# Patient Record
Sex: Female | Born: 1990 | Race: White | Hispanic: No
Health system: Southern US, Community
[De-identification: ages and names within clinical notes are randomized; demographics above are authoritative.]

## PROBLEM LIST (undated history)

## (undated) ENCOUNTER — Inpatient Hospital Stay (HOSPITAL_COMMUNITY): Payer: Self-pay

## (undated) DIAGNOSIS — N83201 Unspecified ovarian cyst, right side: Secondary | ICD-10-CM

## (undated) DIAGNOSIS — E039 Hypothyroidism, unspecified: Secondary | ICD-10-CM

## (undated) DIAGNOSIS — J189 Pneumonia, unspecified organism: Secondary | ICD-10-CM

## (undated) DIAGNOSIS — E079 Disorder of thyroid, unspecified: Secondary | ICD-10-CM

## (undated) DIAGNOSIS — F32A Depression, unspecified: Secondary | ICD-10-CM

## (undated) DIAGNOSIS — D649 Anemia, unspecified: Secondary | ICD-10-CM

## (undated) DIAGNOSIS — F419 Anxiety disorder, unspecified: Secondary | ICD-10-CM

## (undated) DIAGNOSIS — R112 Nausea with vomiting, unspecified: Secondary | ICD-10-CM

## (undated) DIAGNOSIS — J45909 Unspecified asthma, uncomplicated: Secondary | ICD-10-CM

## (undated) DIAGNOSIS — T8859XA Other complications of anesthesia, initial encounter: Secondary | ICD-10-CM

## (undated) DIAGNOSIS — Z9889 Other specified postprocedural states: Secondary | ICD-10-CM

## (undated) HISTORY — DX: Unspecified asthma, uncomplicated: J45.909

## (undated) HISTORY — PX: CERVICAL CERCLAGE: SHX1329

## (undated) HISTORY — PX: ANKLE FRACTURE SURGERY: SHX122

---
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
## 2013-07-25 ENCOUNTER — Emergency Department (HOSPITAL_COMMUNITY)
Admission: EM | Admit: 2013-07-25 | Discharge: 2013-07-25 | Disposition: A | Discharge status: Home / Self Care | Payer: BC Managed Care – PPO | Source: Home / Self Care | Attending: Family Medicine | Admitting: Family Medicine

## 2013-07-25 ENCOUNTER — Encounter (HOSPITAL_COMMUNITY): Payer: Self-pay | Admitting: Emergency Medicine

## 2013-07-25 DIAGNOSIS — K121 Other forms of stomatitis: Secondary | ICD-10-CM

## 2013-07-25 LAB — POCT RAPID STREP A: Streptococcus, Group A Screen (Direct): NEGATIVE

## 2013-07-25 MED ORDER — MAGIC MOUTHWASH: 5.0000 mL | Freq: Three times a day (TID) | ORAL | Status: AC

## 2013-07-25 NOTE — ED Notes (Signed)
22 yr is here today with complaints of mouth irritation - lesion on roof of mouth x 2 dys. She also states that she has a ST, chills, ear pain - B, cough-dry, sinus press - bridge of nose and HA x 2 dys. Denies: SOB; chest congestion

## 2013-07-25 NOTE — ED Provider Notes (Signed)
CSN: [id number]     Arrival date & time 07/25/13  1234 History   First MD Initiated Contact with Patient 07/25/13 1442     Chief Complaint  Patient presents with  . Sinusitis    ST.chills;ear pain; cough - dry; sinus press - bridge of nose; HA x 2 dys  . Mouth Lesions    roof of mouth   (Consider location/radiation/quality/duration/timing/severity/associated sxs/prior Treatment) HPI Comments: Patient report several days of URI sx without associated fever. Describes ST, sinus congestion/pressure, ear congestion/pressure, tender cervical lymph nodes, and generalized malaise. Also reports multiple "sores" on the roof of her mouth that are painful.   The history is provided by the patient.    History reviewed. No pertinent past medical history. Past Surgical History  Procedure Laterality Date  . Ankle fracture surgery    . Cesarean section     No family history on file. History  Substance Use Topics  . Smoking status: Never Smoker   . Smokeless tobacco: Not on file  . Alcohol Use: Yes   OB History   Grav Para Term Preterm Abortions TAB SAB Ect Mult Living                 Review of Systems  Constitutional: Positive for chills. Negative for fever.  HENT: Positive for mouth sores and sinus pressure.   Eyes: Negative.   Respiratory: Negative.   Cardiovascular: Negative.   Gastrointestinal: Negative.   Genitourinary: Negative.   Musculoskeletal: Positive for myalgias.  Skin: Positive for rash.       See HPI  Neurological: Negative.   Psychiatric/Behavioral: Negative.     Allergies  Review of patient's allergies indicates no known allergies.  Home Medications   Current Outpatient Rx  Name  Route  Sig  Dispense  Refill  . Alum & Mag Hydroxide-Simeth (MAGIC MOUTHWASH) SOLN   Oral   Take 5 mLs by mouth 3 (three) times daily.   150 mL   0    BP 121/76  Pulse 99  Temp(Src) 99 F (37.2 C) (Oral)  Resp 18  SpO2 100%  LMP 07/14/2013 Physical Exam  Nursing note  and vitals reviewed. Constitutional: She appears well-developed and well-nourished. No distress.  HENT:  Head: Normocephalic and atraumatic.  Right Ear: Hearing, tympanic membrane, external ear and ear canal normal.  Left Ear: Hearing, tympanic membrane, external ear and ear canal normal.  Nose: Nose normal. Right sinus exhibits no maxillary sinus tenderness and no frontal sinus tenderness. Left sinus exhibits no maxillary sinus tenderness and no frontal sinus tenderness.  Mouth/Throat:    Pulmonary/Chest: Effort normal and breath sounds normal.    ED Course  Procedures (including critical care time) Labs Review Labs Reviewed  CULTURE, GROUP A STREP  POCT RAPID STREP A ([facility] CARE ONLY)   Imaging Review No results found.  EKG Interpretation    Date/Time:    Ventricular Rate:    PR Interval:    QRS Duration:   QT Interval:    QTC Calculation:   R Axis:     Text Interpretation:              MDM   1. Stomatitis    Rapid strep screen negative.    Jess Barters [place], [place] 07/25/13 1725

## 2013-07-27 NOTE — ED Provider Notes (Signed)
Medical screening examination/treatment/procedure(s) were performed by a resident physician or non-physician practitioner and as the supervising physician I was immediately available for consultation/collaboration.  Clementeen Graham, MD    Rodolph Bong, MD 07/27/13 [phone]

## 2013-07-28 LAB — CULTURE, GROUP A STREP

## 2014-02-13 ENCOUNTER — Observation Stay (HOSPITAL_COMMUNITY)
Admission: EM | Admit: 2014-02-13 | Discharge: 2014-02-14 | Disposition: A | Discharge status: Home / Self Care | Payer: Medicaid Other | Attending: Emergency Medicine | Admitting: Emergency Medicine

## 2014-02-13 ENCOUNTER — Encounter (HOSPITAL_COMMUNITY): Payer: Self-pay | Admitting: Emergency Medicine

## 2014-02-13 DIAGNOSIS — R110 Nausea: Secondary | ICD-10-CM | POA: Diagnosis not present

## 2014-02-13 DIAGNOSIS — M79604 Pain in right leg: Secondary | ICD-10-CM | POA: Diagnosis present

## 2014-02-13 DIAGNOSIS — M7989 Other specified soft tissue disorders: Secondary | ICD-10-CM

## 2014-02-13 DIAGNOSIS — Z3202 Encounter for pregnancy test, result negative: Secondary | ICD-10-CM | POA: Diagnosis not present

## 2014-02-13 DIAGNOSIS — R0602 Shortness of breath: Secondary | ICD-10-CM | POA: Diagnosis not present

## 2014-02-13 DIAGNOSIS — R609 Edema, unspecified: Principal | ICD-10-CM | POA: Insufficient documentation

## 2014-02-13 DIAGNOSIS — R600 Localized edema: Secondary | ICD-10-CM

## 2014-02-13 LAB — CBC WITH DIFFERENTIAL/PLATELET
BASOS ABS: 0 10*3/uL (ref 0.0–0.1)
BASOS PCT: 1 % (ref 0–1)
Eosinophils Absolute: 0.2 10*3/uL (ref 0.0–0.7)
Eosinophils Relative: 2 % (ref 0–5)
HCT: 37.7 % (ref 36.0–46.0)
Hemoglobin: 12.6 g/dL (ref 12.0–15.0)
LYMPHS ABS: 1.8 10*3/uL (ref 0.7–4.0)
Lymphocytes Relative: 27 % (ref 12–46)
MCH: 30.3 pg (ref 26.0–34.0)
MCHC: 33.4 g/dL (ref 30.0–36.0)
MCV: 90.6 fL (ref 78.0–100.0)
Monocytes Absolute: 0.4 10*3/uL (ref 0.1–1.0)
Monocytes Relative: 6 % (ref 3–12)
NEUTROS PCT: 64 % (ref 43–77)
Neutro Abs: 4.2 10*3/uL (ref 1.7–7.7)
Platelets: 268 10*3/uL (ref 150–400)
RBC: 4.16 MIL/uL (ref 3.87–5.11)
RDW: 13.6 % (ref 11.5–15.5)
WBC: 6.5 10*3/uL (ref 4.0–10.5)

## 2014-02-13 LAB — COMPREHENSIVE METABOLIC PANEL
ALBUMIN: 4 g/dL (ref 3.5–5.2)
ALK PHOS: 40 U/L (ref 39–117)
ALT: 41 U/L — AB (ref 0–35)
AST: 23 U/L (ref 0–37)
BUN: 11 mg/dL (ref 6–23)
CALCIUM: 9.4 mg/dL (ref 8.4–10.5)
CO2: 28 mEq/L (ref 19–32)
Chloride: 100 mEq/L (ref 96–112)
Creatinine, Ser: 0.65 mg/dL (ref 0.50–1.10)
GFR calc Af Amer: >90 mL/min (ref >90)
GFR calc non Af Amer: >90 mL/min (ref >90)
GLUCOSE: 93 mg/dL (ref 70–99)
POTASSIUM: 3.7 meq/L (ref 3.7–5.3)
SODIUM: 140 meq/L (ref 137–147)
TOTAL PROTEIN: 7.2 g/dL (ref 6.0–8.3)
Total Bilirubin: 0.3 mg/dL (ref 0.3–1.2)

## 2014-02-13 LAB — URINALYSIS, ROUTINE W REFLEX MICROSCOPIC
Bilirubin Urine: NEGATIVE
Glucose, UA: NEGATIVE mg/dL
HGB URINE DIPSTICK: NEGATIVE
KETONES UR: NEGATIVE mg/dL
Leukocytes, UA: NEGATIVE
NITRITE: NEGATIVE
Protein, ur: NEGATIVE mg/dL
SPECIFIC GRAVITY, URINE: 1.02 (ref 1.005–1.030)
UROBILINOGEN UA: 0.2 mg/dL (ref 0.0–1.0)
pH: 6 (ref 5.0–8.0)

## 2014-02-13 LAB — POC URINE PREG, ED: PREG TEST UR: NEGATIVE

## 2014-02-13 NOTE — ED Notes (Signed)
Patient also reports possibility of pregnancy.

## 2014-02-13 NOTE — ED Provider Notes (Signed)
Patient seen/ex[facility] in the Emergency Department [facility] reports right LE pain/swelling Exam : right LE is erythematous and with pitting edema and is larger than left LE.  Distal pulses intact Plan: will need evaluation for DVT and also treatment for possible cellulitis    Joya Gaskinsonald W Ryszard Socarras, MD 02/13/14 2357

## 2014-02-13 NOTE — ED Notes (Signed)
Patient reports lower extremity edema x 2 weeks, worse to right leg.

## 2014-02-13 NOTE — ED Provider Notes (Signed)
CSN: [id number]     Arrival date & time 02/13/14  2131 History   First MD Initiated Contact with Patient 02/13/14 2153     Chief Complaint  Patient presents with  . Leg Swelling     (Consider location/radiation/quality/duration/timing/severity/associated sxs/prior Treatment) The history is provided by the patient.   Claudia Price is a G3 P1, LMP 01/18/2014. She was using IUD for birth control until 5/18 and had it taken out. She presents to the ED with lower extremity swelling and the possibility of being pregnant. She states that her leg started swelling about 2 weeks ago. The patient traveled her from [place] 2 weeks ago. The swelling was mild prior to leaving on the trip for a day or two but would go down when she elevated her leg. Since she arrived here the symptoms are worse. The swelling has gotten worse in the right leg and now there is redness. The swelling extends above knee on the right. She has never had anything like this before. She denies calf pain. The pain increased with walking and standing for a long time. Hx of ankle fracture to the right that required surgery 2004 but has done well since then. The patient since she just  moved to town she has no transportation. They were able to go on line and find someone that agreed to bring them to the ER tonight.    History reviewed. No pertinent past medical history. Past Surgical History  Procedure Laterality Date  . Ankle fracture surgery    . Cesarean section     History reviewed. No pertinent family history. History  Substance Use Topics  . Smoking status: Never Smoker   . Smokeless tobacco: Not on file  . Alcohol Use: Yes   OB History   Grav Para Term Preterm Abortions TAB SAB Ect Mult Living                 Review of Systems  Constitutional: Negative for chills and unexpected weight change.  HENT: Negative.   Eyes: Negative for pain, redness, itching and visual disturbance.  Respiratory: Positive for shortness  of breath. Negative for cough.   Cardiovascular: Negative for chest pain.  Gastrointestinal: Positive for nausea. Negative for vomiting and abdominal pain.  Genitourinary: Negative for dysuria, urgency, frequency, vaginal bleeding and vaginal discharge.  Musculoskeletal: Negative for back pain and myalgias.       Bilateral lower extremity swelling, right more than left.   Skin: Negative for rash.  Psychiatric/Behavioral: Negative for confusion. The patient is not nervous/anxious.       Allergies  Review of patient's allergies indicates no known allergies.  Home Medications   Prior to Admission medications   Not on File   BP 119/63  Pulse 79  Temp(Src) 97.8 F (36.6 C) (Oral)  Resp 20  Ht 5\' 11"  (1.803 m)  Wt 309 lb 14.4 oz (140.57 kg)  BMI 43.24 kg/m2  SpO2 100%  LMP 01/18/2014 Physical Exam  Nursing note and vitals reviewed. Constitutional: She is oriented to person, place, and time. She appears well-developed and well-nourished. No distress.  HENT:  Head: Normocephalic and atraumatic.  Eyes: Conjunctivae and EOM are normal.  Neck: Normal range of motion. Neck supple.  Cardiovascular: Normal rate and regular rhythm.   Pulmonary/Chest: Effort normal. No respiratory distress. She has no rales.  Abdominal: Soft. There is no tenderness.  Musculoskeletal: Normal range of motion. She exhibits edema.       Right lower leg: She  exhibits tenderness and edema.  There is edema and erythema and tenderness to the right lower leg. Pedal pulses equal, adequate circulation, good touch sensation. No calf pain on exam.  Neurological: She is alert and oriented to person, place, and time. No cranial nerve deficit.  Skin: Skin is warm and dry.  Psychiatric: She has a normal mood and affect. Her behavior is normal.   Results for orders placed during the hospital encounter of 02/13/14 (from the past 24 hour(s))  URINALYSIS, ROUTINE W REFLEX MICROSCOPIC     Status: Abnormal   Collection  Time    02/13/14 10:20 PM      Result Value Ref Range   Color, Urine YELLOW  YELLOW   APPearance HAZY (*) CLEAR   Specific Gravity, Urine 1.020  1.005 - 1.030   pH 6.0  5.0 - 8.0   Glucose, UA NEGATIVE  NEGATIVE mg/dL   Hgb urine dipstick NEGATIVE  NEGATIVE   Bilirubin Urine NEGATIVE  NEGATIVE   Ketones, ur NEGATIVE  NEGATIVE mg/dL   Protein, ur NEGATIVE  NEGATIVE mg/dL   Urobilinogen, UA 0.2  0.0 - 1.0 mg/dL   Nitrite NEGATIVE  NEGATIVE   Leukocytes, UA NEGATIVE  NEGATIVE  POC URINE PREG, ED     Status: None   Collection Time    02/13/14 10:22 PM      Result Value Ref Range   Preg Test, Ur NEGATIVE  NEGATIVE  CBC WITH DIFFERENTIAL     Status: None   Collection Time    02/13/14 10:59 PM      Result Value Ref Range   WBC 6.5  4.0 - 10.5 K/uL   RBC 4.16  3.87 - 5.11 MIL/uL   Hemoglobin 12.6  12.0 - 15.0 g/dL   HCT 95.637.7  21.336.0 - 08.646.0 %   MCV 90.6  78.0 - 100.0 fL   MCH 30.3  26.0 - 34.0 pg   MCHC 33.4  30.0 - 36.0 g/dL   RDW 57.813.6  46.911.5 - 62.915.5 %   Platelets 268  150 - 400 K/uL   Neutrophils Relative % 64  43 - 77 %   Neutro Abs 4.2  1.7 - 7.7 K/uL   Lymphocytes Relative 27  12 - 46 %   Lymphs Abs 1.8  0.7 - 4.0 K/uL   Monocytes Relative 6  3 - 12 %   Monocytes Absolute 0.4  0.1 - 1.0 K/uL   Eosinophils Relative 2  0 - 5 %   Eosinophils Absolute 0.2  0.0 - 0.7 K/uL   Basophils Relative 1  0 - 1 %   Basophils Absolute 0.0  0.0 - 0.1 K/uL  COMPREHENSIVE METABOLIC PANEL     Status: Abnormal   Collection Time    02/13/14 10:59 PM      Result Value Ref Range   Sodium 140  137 - 147 mEq/L   Potassium 3.7  3.7 - 5.3 mEq/L   Chloride 100  96 - 112 mEq/L   CO2 28  19 - 32 mEq/L   Glucose, Bld 93  70 - 99 mg/dL   BUN 11  6 - 23 mg/dL   Creatinine, Ser 5.280.65  0.50 - 1.10 mg/dL   Calcium 9.4  8.4 - 41.310.5 mg/dL   Total Protein 7.2  6.0 - 8.3 g/dL   Albumin 4.0  3.5 - 5.2 g/dL   AST 23  0 - 37 U/L   ALT 41 (*) 0 - 35 U/L   Alkaline  Phosphatase 40  39 - 117 U/L   Total Bilirubin  0.3  0.3 - 1.2 mg/dL   GFR calc non Af Amer >90  >90 mL/min   GFR calc Af Amer >90  >90 mL/min    ED Course  Procedures Dr. Bebe Shaggy in to examine the patient and feel that patient needs to come in for observation and antibiotics and have DVT study in the am.  MDM  23 y.o. female with right lower extremity edema and erythema. Will admit for observation and start antibiotics and Lovenox. Dr. Onalee Hua to admit.       [facility] Orlene Och, [place] 02/14/14 [phone]

## 2014-02-14 ENCOUNTER — Encounter (HOSPITAL_COMMUNITY): Payer: Self-pay | Admitting: *Deleted

## 2014-02-14 ENCOUNTER — Observation Stay (HOSPITAL_COMMUNITY): Payer: Medicaid Other

## 2014-02-14 DIAGNOSIS — M79604 Pain in right leg: Secondary | ICD-10-CM | POA: Diagnosis present

## 2014-02-14 DIAGNOSIS — M7989 Other specified soft tissue disorders: Secondary | ICD-10-CM

## 2014-02-14 DIAGNOSIS — M79609 Pain in unspecified limb: Secondary | ICD-10-CM

## 2014-02-14 DIAGNOSIS — R609 Edema, unspecified: Principal | ICD-10-CM

## 2014-02-14 LAB — HCG, SERUM, QUALITATIVE: Preg, Serum: NEGATIVE

## 2014-02-14 LAB — D-DIMER, QUANTITATIVE: D-Dimer, Quant: <0.27 ug/mL-FEU (ref 0.00–0.48)

## 2014-02-14 MED ORDER — ENOXAPARIN SODIUM 150 MG/ML ~~LOC~~ SOLN
1.0000 mg/kg | Freq: Once | SUBCUTANEOUS | Status: AC
  Administered 2014-02-14: 140 mg via SUBCUTANEOUS
  Filled 2014-02-14: qty 1

## 2014-02-14 MED ORDER — ENOXAPARIN SODIUM 150 MG/ML ~~LOC~~ SOLN
1.0000 mg/kg | Freq: Two times a day (BID) | SUBCUTANEOUS | Status: DC
Start: 2014-02-14 — End: 2014-02-14
  Administered 2014-02-14: 140 mg via SUBCUTANEOUS
  Filled 2014-02-14 (×3): qty 1

## 2014-02-14 MED ORDER — ENOXAPARIN SODIUM 150 MG/ML ~~LOC~~ SOLN
SUBCUTANEOUS | Status: AC
  Filled 2014-02-14: qty 1

## 2014-02-14 MED ORDER — ENOXAPARIN SODIUM 40 MG/0.4ML ~~LOC~~ SOLN: 40.0000 mg | SUBCUTANEOUS | Status: DC

## 2014-02-14 MED ORDER — CLINDAMYCIN PHOSPHATE 600 MG/50ML IV SOLN
600.0000 mg | Freq: Once | INTRAVENOUS | Status: AC
  Administered 2014-02-14: 600 mg via INTRAVENOUS
  Filled 2014-02-14: qty 50

## 2014-02-14 MED ORDER — PRENATAL MULTIVITAMIN CH
1.0000 | ORAL_TABLET | Freq: Every day | ORAL | Status: DC
  Administered 2014-02-14: 1 via ORAL
  Filled 2014-02-14 (×2): qty 1

## 2014-02-14 MED ORDER — SODIUM CHLORIDE 0.9 % IJ SOLN: 3.0000 mL | INTRAMUSCULAR | Status: DC | PRN

## 2014-02-14 MED ORDER — SODIUM CHLORIDE 0.9 % IJ SOLN
3.0000 mL | Freq: Two times a day (BID) | INTRAMUSCULAR | Status: DC
  Administered 2014-02-14: 3 mL via INTRAVENOUS

## 2014-02-14 MED ORDER — SODIUM CHLORIDE 0.9 % IV SOLN: 250.0000 mL | INTRAVENOUS | Status: DC | PRN

## 2014-02-14 NOTE — Discharge Summary (Signed)
Physician Discharge Summary  Claudia Price ZOX:[id number] DOB: 11/09/1990 DOA: 02/13/2014  PCP: No PCP Per Patient  Admit date: 02/13/2014 Discharge date: 02/14/2014  Time spent: 20 minutes  Recommendations for Outpatient Follow-up:  1. Recommended establish as OP with a PCP  2. No Abx or further work-up required  Discharge Diagnoses:  Principal Problem:   Right leg swelling Active Problems:   Leg pain, right   Discharge Condition: good  Diet recommendation: reglar  Filed Weights   02/13/14 2145  Weight: 140.57 kg (309 lb 14.4 oz)    History of present illness:  23 yr old female admitted with LE bilateral swelling-came to Ed with Risk factors of 9 hour travel, to be rlued out for DVT  Empirically started on Lovenox.  Vasc Koreas performed showed no findings. LE's actually started to swell 2 weeks rpior to admit with mild erythema-no concern for cellulitis as no significant erythema [other than sunburnt appearance no more than on UE's] D/c home with instructions to find PCP, no need abx  Discharge Exam: Filed Vitals:   02/14/14 0636  BP: 102/66  Pulse: 71  Temp: 98.3 F (36.8 C)  Resp: 20    General: alert pleasant oriented Cardiovascular: s1 s 2no m/r/g Respiratory: clear, no added sound  Discharge Instructions You were cared for by a hospitalist during your hospital stay. If you have any questions about your discharge medications or the care you received while you were in the hospital after you are discharged, you can call the unit and asked to speak with the hospitalist on call if the hospitalist that took care of you is not available. Once you are discharged, your primary care physician will handle any further medical issues. Please note that NO REFILLS for any discharge medications will be authorized once you are discharged, as it is imperative that you return to your primary care physician (or establish a relationship with a primary care physician if you do not  have one) for your aftercare needs so that they can reassess your need for medications and monitor your lab values.  Discharge Instructions   Diet - low sodium heart healthy    Complete by:  As directed      Increase activity slowly    Complete by:  As directed             Medication List         prenatal multivitamin Tabs tablet  Take 1 tablet by mouth daily at 12 noon.       No Known Allergies    The results of significant diagnostics from this hospitalization (including imaging, microbiology, ancillary and laboratory) are listed below for reference.    Significant Diagnostic Studies: Koreas Venous Img Lower Bilateral  02/14/2014   CLINICAL DATA:  Evaluate leg swelling, right greater than left.  EXAM: BILATERAL LOWER EXTREMITY VENOUS DOPPLER ULTRASOUND  TECHNIQUE: Gray-scale sonography with graded compression, as well as color Doppler and duplex ultrasound were performed to evaluate the lower extremity deep venous systems from the level of the common femoral vein and including the common femoral, femoral, profunda femoral, popliteal and calf veins including the posterior tibial, peroneal and gastrocnemius veins when visible. The superficial great saphenous vein was also interrogated. Spectral Doppler was utilized to evaluate flow at rest and with distal augmentation maneuvers in the common femoral, femoral and popliteal veins.  COMPARISON:  None.  FINDINGS: RIGHT LOWER EXTREMITY  Common Femoral Vein: No evidence of thrombus. Normal compressibility, respiratory phasicity and response  to augmentation.  Saphenofemoral Junction: No evidence of thrombus. Normal compressibility and flow on color Doppler imaging.  Profunda Femoral Vein: No evidence of thrombus. Normal compressibility and flow on color Doppler imaging.  Femoral Vein: No evidence of thrombus. Normal compressibility, respiratory phasicity and response to augmentation.  Popliteal Vein: No evidence of thrombus. Normal compressibility,  respiratory phasicity and response to augmentation.  Calf Veins: No evidence of thrombus. Normal compressibility and flow on color Doppler imaging.  Superficial Great Saphenous Vein: No evidence of thrombus. Normal compressibility and flow on color Doppler imaging.  Venous Reflux:  None.  Other Findings:  None.  LEFT LOWER EXTREMITY  Common Femoral Vein: No evidence of thrombus. Normal compressibility, respiratory phasicity and response to augmentation.  Saphenofemoral Junction: No evidence of thrombus. Normal compressibility and flow on color Doppler imaging.  Profunda Femoral Vein: No evidence of thrombus. Normal compressibility and flow on color Doppler imaging.  Femoral Vein: No evidence of thrombus. Normal compressibility, respiratory phasicity and response to augmentation.  Popliteal Vein: No evidence of thrombus. Normal compressibility, respiratory phasicity and response to augmentation.  Calf Veins: No evidence of thrombus. Normal compressibility and flow on color Doppler imaging.  Superficial Great Saphenous Vein: No evidence of thrombus. Normal compressibility and flow on color Doppler imaging.  Venous Reflux:  None.  Other Findings:  None.  IMPRESSION: No evidence of deep venous thrombosis.   Electronically Signed   By: Signa Kellaylor  Stroud M.D.   On: 02/14/2014 10:33    Microbiology: No results found for this or any previous visit (from the past 240 hour(s)).   Labs: Basic Metabolic Panel:  Recent Labs Lab 02/13/14 2259  NA 140  K 3.7  CL 100  CO2 28  GLUCOSE 93  BUN 11  CREATININE 0.65  CALCIUM 9.4   Liver Function Tests:  Recent Labs Lab 02/13/14 2259  AST 23  ALT 41*  ALKPHOS 40  BILITOT 0.3  PROT 7.2  ALBUMIN 4.0   No results found for this basename: LIPASE, AMYLASE,  in the last 168 hours No results found for this basename: AMMONIA,  in the last 168 hours CBC:  Recent Labs Lab 02/13/14 2259  WBC 6.5  NEUTROABS 4.2  HGB 12.6  HCT 37.7  MCV 90.6  PLT 268    Cardiac Enzymes: No results found for this basename: CKTOTAL, CKMB, CKMBINDEX, TROPONINI,  in the last 168 hours BNP: BNP (last 3 results) No results found for this basename: PROBNP,  in the last 8760 hours CBG: No results found for this basename: GLUCAP,  in the last 168 hours     Signed:  Rhetta MuraSAMTANI, JAI-GURMUKH  [facility] 02/14/2014, 1:16 PM

## 2014-02-14 NOTE — H&P (Signed)
Chief Complaint:   leg swelling  HPI: 23 yo female healthy just drove 9 hours from [place] moving here with her husband and 873 yo son comes in with worsening right leg swelling and pain.  She says the leg has been swelling for 2 weeks prior to the traveling.  But has gotten much worse since the drive, and now the left leg is starting to swell.  No fevers.  The rt leg hurts more than the left, and the right leg has some patchy redness up past the thigh, and the left leg is starting to do the same.  Middle of may she had her paraguard copper IUD removed, as they are trying to get pregnant.  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  No known family h/o vte and no personal h/o vte.  No cp.  Some mild sob.  Review of Systems:  Positive and negative as per HPI otherwise all other systems are negative  Past Medical History: History reviewed. No pertinent past medical history. Past Surgical History  Procedure Laterality Date  . Ankle fracture surgery    . Cesarean section      Medications: Prior to Admission medications   Medication Sig Start Date End Date Taking? Authorizing Provider  Prenatal Vit-Fe Fumarate-FA (PRENATAL MULTIVITAMIN) TABS tablet Take 1 tablet by mouth daily at 12 noon.   Yes Historical Provider, MD    Allergies:  No Known Allergies  Social History:  reports that she has never smoked. She does not have any smokeless tobacco history on file. She reports that she drinks alcohol. She reports that she does not use illicit drugs.  Family History: History reviewed. No pertinent family history.  Physical Exam: Filed Vitals:   02/13/14 2145  BP: 119/63  Pulse: 79  Temp: 97.8 F (36.6 C)  TempSrc: Oral  Resp: 20  Height: 5\' 11"  (1.803 m)  Weight: 140.57 kg (309 lb 14.4 oz)  SpO2: 100%   General appearance: alert, cooperative and no distress Head: Normocephalic,  without obvious abnormality, atraumatic Eyes: negative Nose: Nares normal. Septum midline. Mucosa normal. No drainage or sinus tenderness. Neck: no JVD and supple, symmetrical, trachea midline Lungs: clear to auscultation bilaterally Heart: regular rate and rhythm, S1, S2 normal, no murmur, click, rub or gallop Abdomen: soft, non-tender; bowel sounds normal; no masses,  no organomegaly Extremities: extremities normal, atraumatic, no cyanosis or edema x rle more swollen than lle with some patchy erythema to ble more so to rle up past thigh  Pulses: 2+ and symmetric Skin: Skin color, texture, turgor normal. No rashes or lesions Neurologic: Grossly normal    Labs on Admission:   Recent Labs  02/13/14 2259  NA 140  K 3.7  CL 100  CO2 28  GLUCOSE 93  BUN 11  CREATININE 0.65  CALCIUM 9.4    Recent Labs  02/13/14 2259  AST 23  ALT 41*  ALKPHOS 40  BILITOT 0.3  PROT 7.2  ALBUMIN 4.0    Recent Labs  02/13/14 2259  WBC 6.5  NEUTROABS 4.2  HGB 12.6  HCT 37.7  MCV 90.6  PLT 268   Radiological Exams on Admission: No results found.  Assessment/Plan  23 yo female with ble swelling right more than left concerning for dvt  Principal Problem:   Right leg swelling-  Obtain ble ultrasound.  Place on full lovenox until then.  This is more worrisome for underlying dvt than cellulitis.  Will hold off on any further abx for now, did get one dose of clinda in ED.  Will ck ddimer also, would have low threshold to do cta of her chest of any significant symptoms develop of possible PE.  Will also ck serum hcg test, upt is negative.  Active Problems:   Leg pain, right  obs on med.  Full code.  Teanna Elem A 02/14/2014, 12:11 AM

## 2014-02-14 NOTE — Plan of Care (Signed)
Problem: Discharge Progression Outcomes Goal: Pain controlled with appropriate interventions Outcome: Completed/Met Date Met:  02/14/14 denies

## 2014-02-14 NOTE — Progress Notes (Signed)
UR Completed.  Claudia Price [phone] 02/14/2014  

## 2014-02-15 NOTE — ED Provider Notes (Signed)
Medical screening examination/treatment/procedure(s) were conducted as a shared visit with non-physician practitioner(s) and myself.  I personally evaluated the patient during the encounter.   EKG Interpretation None        Joya Gaskinsonald W Belle Charlie, MD 02/15/14 [phone]

## 2014-06-02 ENCOUNTER — Encounter (HOSPITAL_COMMUNITY): Payer: Self-pay | Admitting: Emergency Medicine

## 2014-06-02 ENCOUNTER — Emergency Department (HOSPITAL_COMMUNITY)
Admission: EM | Admit: 2014-06-02 | Discharge: 2014-06-02 | Disposition: A | Discharge status: Home / Self Care | Payer: Medicaid Other | Attending: Emergency Medicine | Admitting: Emergency Medicine

## 2014-06-02 DIAGNOSIS — H9202 Otalgia, left ear: Secondary | ICD-10-CM

## 2014-06-02 DIAGNOSIS — H9209 Otalgia, unspecified ear: Secondary | ICD-10-CM | POA: Insufficient documentation

## 2014-06-02 DIAGNOSIS — O9989 Other specified diseases and conditions complicating pregnancy, childbirth and the puerperium: Secondary | ICD-10-CM | POA: Diagnosis not present

## 2014-06-02 DIAGNOSIS — Z79899 Other long term (current) drug therapy: Secondary | ICD-10-CM | POA: Insufficient documentation

## 2014-06-02 MED ORDER — ANTIPYRINE-BENZOCAINE 5.4-1.4 % OT SOLN
3.0000 [drp] | Freq: Once | OTIC | Status: AC
  Administered 2014-06-02: 3 [drp] via OTIC
  Filled 2014-06-02: qty 10

## 2014-06-02 MED ORDER — FLUTICASONE PROPIONATE 50 MCG/ACT NA SUSP
2.0000 | Freq: Every day | NASAL | Status: DC
  Administered 2014-06-02: 2 via NASAL
  Filled 2014-06-02: qty 16

## 2014-06-02 NOTE — ED Provider Notes (Signed)
Medical screening examination/treatment/procedure(s) were performed by non-physician practitioner and as supervising physician I was immediately available for consultation/collaboration.     Ninette Cotta, MD 06/02/14 0135 

## 2014-06-02 NOTE — ED Notes (Signed)
The patient said her ear began to hurt her and her throat is sore.  She also says she is having a runny nose and it is clear.  She rates her pain 5/10 and it is intermittent.  The patient said her sore throat is gone, but her ear is still bothering her and she has a runny nose.

## 2014-06-02 NOTE — Discharge Instructions (Signed)
Do not hesitate to return to the emergency room for any new, worsening or concerning symptoms.  Please obtain primary care using resource guide below. But the minute you were seen in the emergency room and that they will need to obtain records for further outpatient management.  Use nasal saline (you can try Arm and Hammer Simply Saline) at least 4 times a day, use saline 5-10 minutes before using the fluticasone (flonase)  You may put 3-4 drops of the auralgan in the leaft ear every 2 hours for pain control.   Rest, wash hands frequently  and drink plenty of water.  Follow with OB/GYN as soon as possible.   Do NOT take any NSAIDs, such as Aspirin, Motrin, Ibuprofen, Aleve, Naproxen etc. Only take Tylenol for pain. Return to the emergency room  for any severe abdominal pain, increasing vaginal bleeding, passing out or repeated vomiting.   Obtain over-the-counter prenatal vitamins. Read the label and make sure that they have at least 400 mcg of folate acid.    Please go to the [facility] office in [facility] to apply for coverage. Alternatively, you can could go to the DSS office in [facility] to apply for emergency coverage.        Emergency Department Resource Guide 1) Find a Doctor and Pay Out of Pocket Although you won't have to find out who is covered by your insurance plan, it is a good idea to ask around and get recommendations. You will then need to call the office and see if the doctor you have chosen will accept you as a new patient and what types of options they offer for patients who are self-pay. Some doctors offer discounts or will set up payment plans for their patients who do not have insurance, but you will need to ask so you aren't surprised when you get to your appointment.  2) Contact Your Local Health Department Not all health departments have doctors that can see patients for sick visits, but many do, so it is worth a call to see if yours does. If you don't know where  your local health department is, you can check in your phone book. The CDC also has a tool to help you locate your state's health department, and many state websites also have listings of all of their local health departments.  3) Find a Walk-in Clinic If your illness is not likely to be very severe or complicated, you may want to try a walk in clinic. These are popping up all over the country in pharmacies, drugstores, and shopping centers. They're usually staffed by nurse practitioners or physician assistants that have been trained to treat common illnesses and complaints. They're usually fairly quick and inexpensive. However, if you have serious medical issues or chronic medical problems, these are probably not your best option.  No Primary Care Doctor: - Call Health Connect at  [phone] - they can help you locate a primary care doctor that  accepts your insurance, provides certain services, etc. - Physician Referral Service- [phone]  Chronic Pain Problems: Organization         Address  Phone   Notes  [facility]  [phone] Patients need to be referred by their primary care doctor.   Medication Assistance: Organization         Address  Phone   Notes  [facility] Medication [facility] [address] [place] [place], [place] [address] [phone] --Must be a resident of [address]  [place] -- Must have NO insurance coverage whatsoever (no Medicaid/ Medicare, etc.) -- The pt. MUST have a primary care doctor that directs their care regularly and follows them in the community   [facility]ist  [phone]   [employer]  [phone]    Agencies that provide inexpensive medical care: Organization         Address  Phone   Notes  [facility]  [phone]   [facility]    [phone]   [facility] [address] [place], [place] [address] [phone]   [facility] [address]  [place], [place] [phone]   Planned Parenthood    [phone]   [facility]    [phone]   [facility] and [facility]  [address], [place] Phone:  [phone], Fax:  [phone] Hours of Operation:  9 am - 6 pm, M-F.  Also accepts Medicaid/Medicare and self-pay.  [facility]  [address], [place] Phone: [phone], Fax: [phone]. Hours of Operation:  8:30 am - 5:30 pm, M-F.  Also accepts Medicaid and self-pay.  [facility] [place] [address] [place] Phone: [phone]   [facility] [address] Natasha Bence [place], [place] [phone], Ext. [phone] Mondays & Thursdays: 7-9 AM.  First 15 patients are seen on a first come, first serve basis.    Medicaid-accepting [facility] Providers:  Organization         Address  Phone   Notes  [facility] [address], [place] [phone] Also accepts self-pay patients.  [facility] [address] Laurell Josephs [place], [place]  [phone]   [facility] [address], [place] [phone]   [facility] [address] [place] [phone]   Renaye Rakers [address], [place]   [phone] Only accepts [place] patients after they have their name applied to their card.   Self-Pay (no insurance) in [facility]:  Organization         Address  Phone   Notes  Sickle Cell Patients, [facility] [address] [place], [place] [phone]   [facility] [address] [place], [place] [phone]   [facility] [place]  [address], [place] [phone]   [facility]/Dr. Osei-Bonsu  [address] [place] or [address], [place] [phone] Phone number for both [place] and [place] locations is the same.  [facility] [address] [place] [phone]   [facility] [address] [place] or [address] [phone]   [facility] [address] [place] [phone], phone; [phone], fax Sees patients 1st and 3rd Saturday of every month.  Must not qualify for public or private insurance (i.e. Medicaid, Medicare, [place] Health Choice, Veterans' Benefits)  Household income should be no more than 200% of the poverty level The clinic cannot treat you if you are pregnant or think you are pregnant  Sexually transmitted diseases are not treated at the clinic.    Dental Care: Organization         Address  Phone  Notes  [facility] [address], [place] [phone] Accepts children up to age 25 who are enrolled in [place]icaid or [place] Health Choice; pregnant women with a Medicaid card; and children who have applied for Medicaid or [place] Health Choice, but were declined, whose parents can pay a reduced fee at time of service.  [facility] [place]  [address], [place] [phone] Accepts children up to age 25 who are enrolled in [place] or [place] Health Choice; pregnant women with a Medicaid card; and children who have applied for Medicaid or [place] Health Choice, but were declined, whose parents can pay a reduced fee at time of service.  [facility]  [address], [place] [phone] Patients are seen by appointment only. Walk-ins are not accepted. [facility] will see patients 25 years of age and older. Monday - Tuesday (8am-5pm) Most Wednesdays (8:30-5pm) $30 per visit, cash only  [facility]  [address], [facility] [phone] Patients are seen by appointment only. Walk-ins are not  accepted. [facility] will see patients 25 years of age and older. One Wednesday Evening (Monthly: Volunteer Based).  $30 per visit, cash only  [employer] [facility]  [phone] for adults; Children under age 174, call Graduate Pediatric Dentistry at [phone]. Children aged 25-14, please call [phone] to request a pediatric application.  Dental services are provided in all areas of dental care including fillings, crowns and bridges, complete and partial dentures, implants, gum treatment, root canals, and extractions. Preventive care is also provided. Treatment is provided to both adults and children. Patients are selected via a lottery and there is often a waiting list.   [facility] [address], [place]  [phone] [email or web address]   [facility] [address], [place], [place] [phone], Ext. [phone] Second and Fourth Thursday of each month, opens at 6:30 AM; Clinic ends at 9 AM.  Patients are seen on a first-come first-served basis, and a limited number are seen during each clinic.   [facility]  [address], [place], [place] [phone]   Eligibility Requirements You must have lived in [place], [place], or [place] counties for at least the last three months.   You cannot be eligible for state or federal sponsored National Cityhealthcare insurance, including CIGNAVeterans Administration, [place]icaid, or [employer]icare.   You generally cannot be eligible for healthcare insurance through your employer.    How to apply: Eligibility screenings are held every Tuesday and Wednesday afternoon from 1:00 pm until 4:00 pm. You do not need an appointment for the interview!  [facility] [address], [place], [place] [phone]   [facility]  [phone]   [facility]  [phone]   [facility]  [phone]    Behavioral Health Resources in the  Community: Intensive Outpatient Programs Organization         Address  Phone  Notes  [facility] [address], [place], [place] [phone]   [facility] [address], [place], [place] [phone]   [facility] [address], [place], [place]  [phone]   [facility] [address],  [place], [place] [phone] or [phone]   Substance Abuse Resources Organization  Address  Phone  Notes  [facility] Drug [facility]  [phone]   [facility]  [phone]   [facility]  [phone]   [facility]  [phone]   Residential & Outpatient Substance Abuse Program  [phone]   Psychological Services Organization         Address  Phone  Notes  [facility]  [phone]- [phone]   [facility]  [phone]- [phone]   [facility] [address], [place] [phone] or [phone]    Mobile Crisis Teams Organization         Address  Phone  Notes  [facility], Mobile Crisis Care Unit  [phone]   [facility]  [address] [place], [place] [phone]   Doristine LocksSharon DeEsch [address] [place] [phone]    Self-Help/Support Groups Organization         Address  Phone             Notes  [facility] [place] - variety of support groups  336- [phone] Call for more information  Narcotics Anonymous (NA), [facility] [address], [place]  2 meetings at this location   Statisticianesidential Treatment Programs Organization         Address  Phone  Notes  [facility] [address],    [place]  [phone]   [facility]  [address], [place] [phone], [place], [place] [phone]   [facility] [address], [place] [phone] Admissions: 8am-3pm M-F  [facility] [address]    [place], [place] [phone]   [facility] [address], [place], [place] [phone]   [facility] [address]  [place], [place] [phone]   [facility] - Intensive Outpatient [address], [place], [place] [phone]   [facility] ([facility] [address]  [place], [place] [phone] or [phone]   [facility] ([facility]) [address] [place], [place] [phone] Accepts Medicaid  [facility] [address]  [place] [phone] Substance Abuse/Addiction Treatment   [facility] Resources Organization         Address  Phone  Notes  [facility]  [phone]   Angie FavaJulie Brannon, PhD [address] [place], [place]   [phone] or [phone](336) [phone]   [facility]   [address] [place], [place] [phone]   [facility] [address], [place], [place] [phone] Insurance/Medicaid/sponsorship through [facility]  [facility] [address]                                    [place], [place] [phone] Therapy/tele-psych/case  [facility] [address]   [place], [place] [phone]    Dr. Lolly MustacheArfeen  [phone]   [facility]  [employer] [facility] 1) [address], [place] 2) [address], [place] 3)  [address], [place] [phone]  [phone]   [facility] Child Abuse Hotline [phone]) [phone] or [phone] (After Hours)

## 2014-06-02 NOTE — ED Provider Notes (Signed)
CSN: [id number]     Arrival date & time 06/02/14  0001 History   First MD Initiated Contact with Patient 06/02/14 0028     Chief Complaint  Patient presents with  . Otalgia    The patient said her ear began to hurt her and her throat is sore.  She also says she is having a runny nose and it is clear.  She rates her pain 5/10 and it is intermittent.     (Consider location/radiation/quality/duration/timing/severity/associated sxs/prior Treatment) HPI  Claudia Price is a 23 y.o. female complaining of left ear pain and sore throat with clear rhinorrhea onset several hours ago. Patient denies fever, chills, cough, shortness of breath, abdominal pain, nausea vomiting, change in bowel or bladder habits. Patient states she is [redacted] weeks pregnant. She has an appointment to follow with OB/GYN. She does not have a primary care physician. Patient denies vaginal bleeding, abdominal pain, abnormal vaginal discharge, dysuria, hematuria.  History reviewed. No pertinent past medical history. Past Surgical History  Procedure Laterality Date  . Ankle fracture surgery    . Cesarean section     History reviewed. No pertinent family history. History  Substance Use Topics  . Smoking status: Never Smoker   . Smokeless tobacco: Never Used  . Alcohol Use: No   OB History   Grav Para Term Preterm Abortions TAB SAB Ect Mult Living                 Review of Systems  10 systems reviewed and found to be negative, except as noted in the HPI.   Allergies  Review of patient's allergies indicates no known allergies.  Home Medications   Prior to Admission medications   Medication Sig Start Date End Date Taking? Authorizing Provider  Prenatal Vit-Fe Fumarate-FA (PRENATAL MULTIVITAMIN) TABS tablet Take 1 tablet by mouth daily at 12 noon.    Historical Provider, MD   BP 120/66  Pulse 87  Temp(Src) 97.9 F (36.6 C) (Oral)  Resp 16  SpO2 100%  LMP 03/23/2014 Physical Exam  Nursing note and vitals  reviewed. Constitutional: She is oriented to person, place, and time. She appears well-developed and well-nourished. No distress.  HENT:  Head: Normocephalic and atraumatic.  Mouth/Throat: Oropharynx is clear and moist.  No drooling or stridor. Posterior pharynx mildly erythematous no significant tonsillar hypertrophy. No exudate. Soft palate rises symmetrically. No TTP or induration under tongue.   No tenderness to palpation of frontal or bilateral maxillary sinuses.  No mucosal edema in the nares.  Bilateral tympanic membranes with normal architecture and good light reflex.    Eyes: Conjunctivae and EOM are normal. Pupils are equal, round, and reactive to light.  Neck: Normal range of motion. Neck supple.  Cardiovascular: Normal rate, regular rhythm and intact distal pulses.   Pulmonary/Chest: Effort normal and breath sounds normal. No stridor. No respiratory distress. She has no wheezes. She has no rales. She exhibits no tenderness.  Abdominal: Soft. Bowel sounds are normal. She exhibits no distension and no mass. There is no tenderness. There is no rebound and no guarding.  Musculoskeletal: Normal range of motion.  Neurological: She is alert and oriented to person, place, and time.  Psychiatric: She has a normal mood and affect.    ED Course  Procedures (including critical care time) Labs Review Labs Reviewed - No data to display  Imaging Review No results found.   EKG Interpretation None      MDM   Final diagnoses:  Otalgia  of left ear    Filed Vitals:   06/02/14 0008  BP: 120/66  Pulse: 87  Temp: 97.9 F (36.6 C)  TempSrc: Oral  Resp: 16  SpO2: 100%    Medications  fluticasone (FLONASE) 50 MCG/ACT nasal spray 2 spray (not administered)  antipyrine-benzocaine (AURALGAN) otic solution 3-4 drop (3 drops Left Ear Given 06/02/14 0055)    Claudia Price is a 23 y.o. female presenting with left-sided otalgia, no signs of infection. Patient will be given  around and in Flonase for eustachian tube dysfunction secondary to URI. Patient is [redacted] weeks pregnant, no abdominal pain, abnormal vaginal discharge, vaginal bleeding, dysuria, hematuria. Patient has plans to followup with OB/GYN. She is taking prenatal vitamins. I've counseled the patient not to take NSAIDs. Because patient is uninsured, she will be given Flonase and around and in the ED. We've discussed return precautions.  Evaluation does not show pathology that would require ongoing emergent intervention or inpatient treatment. Pt is hemodynamically stable and mentating appropriately. Discussed findings and plan with patient/guardian, who agrees with care plan. All questions answered. Return precautions discussed and outpatient follow up given.     Claudia Emeryicole Cevin Rubinstein, PA-C 06/02/14 [phone]

## 2014-06-24 ENCOUNTER — Encounter (HOSPITAL_COMMUNITY): Payer: Self-pay

## 2014-06-30 ENCOUNTER — Encounter (HOSPITAL_COMMUNITY): Payer: Self-pay | Admitting: Pharmacist

## 2014-07-05 ENCOUNTER — Encounter (HOSPITAL_COMMUNITY): Payer: Self-pay

## 2014-07-05 LAB — OB RESULTS CONSOLE ABO/RH: RH Type: POSITIVE

## 2014-07-05 LAB — OB RESULTS CONSOLE HEPATITIS B SURFACE ANTIGEN: HEP B S AG: NEGATIVE

## 2014-07-05 LAB — OB RESULTS CONSOLE GC/CHLAMYDIA
Chlamydia: NEGATIVE
Gonorrhea: NEGATIVE

## 2014-07-05 LAB — OB RESULTS CONSOLE RPR: RPR: NONREACTIVE

## 2014-07-05 LAB — OB RESULTS CONSOLE HIV ANTIBODY (ROUTINE TESTING): HIV: NONREACTIVE

## 2014-07-05 LAB — OB RESULTS CONSOLE ANTIBODY SCREEN: ANTIBODY SCREEN: NEGATIVE

## 2014-07-05 LAB — OB RESULTS CONSOLE RUBELLA ANTIBODY, IGM: Rubella: IMMUNE

## 2014-07-08 ENCOUNTER — Ambulatory Visit (HOSPITAL_COMMUNITY)
Admission: RE | Admit: 2014-07-08 | Discharge: 2014-07-08 | Disposition: A | Discharge status: Home / Self Care | Payer: Medicaid Other | Source: Ambulatory Visit | Attending: Obstetrics and Gynecology | Admitting: Obstetrics and Gynecology

## 2014-07-08 ENCOUNTER — Ambulatory Visit (HOSPITAL_COMMUNITY): Payer: Medicaid Other | Admitting: Certified Registered Nurse Anesthetist

## 2014-07-08 ENCOUNTER — Encounter (HOSPITAL_COMMUNITY): Payer: Self-pay | Admitting: Certified Registered Nurse Anesthetist

## 2014-07-08 ENCOUNTER — Encounter (HOSPITAL_COMMUNITY)
Admission: RE | Disposition: A | Discharge status: Home / Self Care | Payer: Self-pay | Source: Ambulatory Visit | Attending: Obstetrics and Gynecology

## 2014-07-08 ENCOUNTER — Other Ambulatory Visit: Payer: Self-pay | Admitting: Obstetrics and Gynecology

## 2014-07-08 ENCOUNTER — Ambulatory Visit (HOSPITAL_COMMUNITY): Payer: Medicaid Other

## 2014-07-08 DIAGNOSIS — O26872 Cervical shortening, second trimester: Secondary | ICD-10-CM

## 2014-07-08 DIAGNOSIS — IMO0002 Reserved for concepts with insufficient information to code with codable children: Secondary | ICD-10-CM

## 2014-07-08 DIAGNOSIS — Z331 Pregnant state, incidental: Secondary | ICD-10-CM | POA: Insufficient documentation

## 2014-07-08 DIAGNOSIS — O3680X0 Pregnancy with inconclusive fetal viability, not applicable or unspecified: Secondary | ICD-10-CM | POA: Insufficient documentation

## 2014-07-08 DIAGNOSIS — O3430 Maternal care for cervical incompetence, unspecified trimester: Secondary | ICD-10-CM | POA: Insufficient documentation

## 2014-07-08 DIAGNOSIS — O09299 Supervision of pregnancy with other poor reproductive or obstetric history, unspecified trimester: Secondary | ICD-10-CM | POA: Insufficient documentation

## 2014-07-08 DIAGNOSIS — Z3A14 14 weeks gestation of pregnancy: Secondary | ICD-10-CM | POA: Insufficient documentation

## 2014-07-08 DIAGNOSIS — Z6841 Body Mass Index (BMI) 40.0 and over, adult: Secondary | ICD-10-CM | POA: Diagnosis not present

## 2014-07-08 DIAGNOSIS — O26879 Cervical shortening, unspecified trimester: Secondary | ICD-10-CM

## 2014-07-08 HISTORY — DX: Anxiety disorder, unspecified: F41.9

## 2014-07-08 HISTORY — DX: Anemia, unspecified: D64.9

## 2014-07-08 HISTORY — PX: CERVICAL CERCLAGE: SHX1329

## 2014-07-08 LAB — CBC
HCT: 34.6 % — ABNORMAL LOW (ref 36.0–46.0)
Hemoglobin: 11.7 g/dL — ABNORMAL LOW (ref 12.0–15.0)
MCH: 29.9 pg (ref 26.0–34.0)
MCHC: 33.8 g/dL (ref 30.0–36.0)
MCV: 88.5 fL (ref 78.0–100.0)
PLATELETS: 247 10*3/uL (ref 150–400)
RBC: 3.91 MIL/uL (ref 3.87–5.11)
RDW: 13.2 % (ref 11.5–15.5)
WBC: 9.5 10*3/uL (ref 4.0–10.5)

## 2014-07-08 SURGERY — CERCLAGE, CERVIX, VAGINAL APPROACH
Anesthesia: Spinal | Site: Vagina

## 2014-07-08 MED ORDER — ONDANSETRON HCL 4 MG/2ML IJ SOLN
INTRAMUSCULAR | Status: AC
  Filled 2014-07-08: qty 2

## 2014-07-08 MED ORDER — FENTANYL CITRATE 0.05 MG/ML IJ SOLN
INTRAMUSCULAR | Status: DC | PRN
  Administered 2014-07-08 (×2): 50 ug via INTRAVENOUS

## 2014-07-08 MED ORDER — INDOMETHACIN 25 MG SUPPOSITORY: 25.0000 mg | Freq: Two times a day (BID) | RECTAL | Status: AC

## 2014-07-08 MED ORDER — SODIUM CHLORIDE 0.9 % IJ SOLN
INTRAMUSCULAR | Status: DC | PRN
  Administered 2014-07-08: 5 mL

## 2014-07-08 MED ORDER — LACTATED RINGERS IV SOLN
INTRAVENOUS | Status: DC
  Administered 2014-07-08 (×2): via INTRAVENOUS

## 2014-07-08 MED ORDER — LIDOCAINE IN DEXTROSE 5-7.5 % IV SOLN
INTRAVENOUS | Status: DC | PRN
  Administered 2014-07-08: 1.4 mL via INTRATHECAL

## 2014-07-08 MED ORDER — ONDANSETRON HCL 4 MG/2ML IJ SOLN
INTRAMUSCULAR | Status: DC | PRN
  Administered 2014-07-08: 4 mg via INTRAVENOUS

## 2014-07-08 MED ORDER — SCOPOLAMINE 1 MG/3DAYS TD PT72
MEDICATED_PATCH | TRANSDERMAL | Status: AC
  Administered 2014-07-08: 1.5 mg via TRANSDERMAL
  Filled 2014-07-08: qty 1

## 2014-07-08 MED ORDER — FENTANYL CITRATE 0.05 MG/ML IJ SOLN
INTRAMUSCULAR | Status: AC
  Filled 2014-07-08: qty 2

## 2014-07-08 MED ORDER — SODIUM CHLORIDE 0.9 % IJ SOLN
INTRAMUSCULAR | Status: AC
  Filled 2014-07-08: qty 50

## 2014-07-08 MED ORDER — SCOPOLAMINE 1 MG/3DAYS TD PT72
1.0000 | MEDICATED_PATCH | Freq: Once | TRANSDERMAL | Status: DC
  Administered 2014-07-08: 1.5 mg via TRANSDERMAL

## 2014-07-08 MED ORDER — PHENYLEPHRINE HCL 10 MG/ML IJ SOLN
INTRAMUSCULAR | Status: DC | PRN
  Administered 2014-07-08 (×2): 40 ug via INTRAVENOUS

## 2014-07-08 MED ORDER — FENTANYL CITRATE 0.05 MG/ML IJ SOLN: 25.0000 ug | INTRAMUSCULAR | Status: DC | PRN

## 2014-07-08 SURGICAL SUPPLY — 25 items
CATH FOLEY 2WAY SLVR 30CC 16FR (CATHETERS) IMPLANT
CLOTH BEACON ORANGE TIMEOUT ST (SAFETY) ×3 IMPLANT
COUNTER NEEDLE 1200 MAGNETIC (NEEDLE) ×2 IMPLANT
ELECT REM PT RETURN 9FT ADLT (ELECTROSURGICAL) ×3
ELECTRODE REM PT RTRN 9FT ADLT (ELECTROSURGICAL) IMPLANT
GLOVE BIO SURGEON STRL SZ 6.5 (GLOVE) ×1 IMPLANT
GLOVE BIO SURGEON STRL SZ7 (GLOVE) ×5 IMPLANT
GLOVE BIO SURGEONS STRL SZ 6.5 (GLOVE) ×1
GOWN STRL REUS W/TWL LRG LVL3 (GOWN DISPOSABLE) ×10 IMPLANT
NEEDLE MAYO .5 CIRCLE (NEEDLE) ×1 IMPLANT
NS IRRIG 1000ML POUR BTL (IV SOLUTION) ×3 IMPLANT
PACK VAGINAL MINOR WOMEN LF (CUSTOM PROCEDURE TRAY) ×3 IMPLANT
PAD OB MATERNITY 4.3X12.25 (PERSONAL CARE ITEMS) ×3 IMPLANT
PAD PREP 24X48 CUFFED NSTRL (MISCELLANEOUS) ×3 IMPLANT
PENCIL BUTTON HOLSTER BLD 10FT (ELECTRODE) ×2 IMPLANT
SUT TICRON 2 BLUE 36 GS-21 (SUTURE) ×6 IMPLANT
SUT VIC AB 3-0 SH 27 (SUTURE) ×3
SUT VIC AB 3-0 SH 27X BRD (SUTURE) ×1 IMPLANT
SYR 30ML LL (SYRINGE) ×2 IMPLANT
TOWEL OR 17X24 6PK STRL BLUE (TOWEL DISPOSABLE) ×6 IMPLANT
TRAY FOLEY CATH 14FR (SET/KITS/TRAYS/PACK) ×3 IMPLANT
TUBING NON-CON 1/4 X 20 CONN (TUBING) ×1 IMPLANT
TUBING NON-CON 1/4 X 20' CONN (TUBING) ×1
WATER STERILE IRR 1000ML POUR (IV SOLUTION) ×1 IMPLANT
YANKAUER SUCT BULB TIP NO VENT (SUCTIONS) ×2 IMPLANT

## 2014-07-08 NOTE — Anesthesia Procedure Notes (Signed)
Spinal Patient location during procedure: OR Start time: 07/08/2014 11:55 AM Staffing Anesthesiologist: CASSIDY, AMY Performed by: anesthesiologist  Preanesthetic Checklist Completed: patient identified, site marked, surgical consent, pre-op evaluation, timeout performed, IV checked, risks and benefits discussed and monitors and equipment checked Spinal Block Patient position: sitting Prep: site prepped and draped and DuraPrep Patient monitoring: blood pressure, continuous pulse ox and heart rate Approach: midline Location: L4-5 Injection technique: single-shot Needle Needle type: Pencan  Needle gauge: 24 G Needle length: 10 cm Assessment Sensory level: T6 Additional Notes Clear free flow CSF on first attempt.  No paresthesia.  Patient tolerated procedure well with no apparent complications.  Pt allowed to sit for 60 second to allow for saddle block  A. Rodman Pickleassidy, MD

## 2014-07-08 NOTE — Progress Notes (Signed)
Unable to obtain fetal heart tones.. Per Dr.Pinn ok.the patient had u/s today and fetal heartones were audbile.

## 2014-07-08 NOTE — Anesthesia Preprocedure Evaluation (Signed)

## 2014-07-08 NOTE — Op Note (Signed)
07/08/2014  12:24 PM  PATIENT:  Claudia Price  23 y.o. female  PRE-OPERATIVE DIAGNOSIS:  INCOMPETENT CERVIX  POST-OPERATIVE DIAGNOSIS:  INCOMPETENT CERVIX  PROCEDURE:  Procedure(s): MODIFIED MCDONALD CERCLAGE CERVICAL (N/A)  SURGEON:  Surgeon(s) and Role:    * Karim Aiello A Kincaid Tiger, MD - Primary    * Walda Pinn, MD - Assisting   ANESTHESIA:   spinal  EBL:  Total I/O In: 1000 [I.V.:1000] Out: -   LOCAL MEDICATIONS USED:  OTHER saline  SPECIMEN:  No Specimen  DISPOSITION OF SPECIMEN:  N/A  COUNTS:  YES  TOURNIQUET:  * No tourniquets in log *  DICTATION: .Note written in EPIC  PLAN OF CARE: Discharge to home after PACU  PATIENT DISPOSITION:  PACU - hemodynamically stable.   Delay start of Pharmacological VTE agent (>24hrs) due to surgical blood loss or risk of bleeding: not applicable  Findings:  4cm cervix without funnelling on US just before operation.  2 cm exposed cervix intravaginal.  2 stitches placed about 3 cm up from os and KNOTS ARE LOCATED IN POSTERIOR CERVIX.    Technique:  After adequate spinal anesthesia was achieved the pt was examined. The pt was then prepped and draped in usual sterile fashion; a foley was placed and bladder emptied.  The cervix was grasped with a fenestrated rings and the bladder reflexion was injected with saline.  The reflexion of the vagina onto the cervix was then tented up and carefully incised with the scalpel and incised metzenbaums up another 2 centimeters, staying close to the cervix and away from the bladder.  A 0 double loaded Ticron was then used to go from 12:00 to 9:00 and 9:00 to 6:00 with one end and then 12:00 to 3:00 and 3:00 to 6:00 with the other end.  A second stitch was placed in the same way just superior anteriorly to the previous stitch and then at least 2 cm superior on the posterior cervix.  Both stitches were tied down tight over a small os dilator and tied on the POSTERIOR CERVIX with AIR KNOTs for  identification.  The bladder flap was closed with a running stitch of 3-0 vicryl R.    The pt tolerated the procedure well and was returned to the recovery room in stable condition.       

## 2014-07-08 NOTE — Brief Op Note (Signed)
07/08/2014  12:24 PM  PATIENT:  Claudia Price  23 y.o. female  PRE-OPERATIVE DIAGNOSIS:  INCOMPETENT CERVIX  POST-OPERATIVE DIAGNOSIS:  INCOMPETENT CERVIX  PROCEDURE:  Procedure(s): MODIFIED MCDONALD CERCLAGE CERVICAL (N/A)  SURGEON:  Surgeon(s) and Role:    * Loney LaurenceMichelle A Murrel Freet, MD - Primary    * Essie HartWalda Pinn, MD - Assisting   ANESTHESIA:   spinal  EBL:  Total I/O In: 1000 [I.V.:1000] Out: -   LOCAL MEDICATIONS USED:  OTHER saline  SPECIMEN:  No Specimen  DISPOSITION OF SPECIMEN:  N/A  COUNTS:  YES  TOURNIQUET:  * No tourniquets in log *  DICTATION: .Note written in EPIC  PLAN OF CARE: Discharge to home after PACU  PATIENT DISPOSITION:  PACU - hemodynamically stable.   Delay start of Pharmacological VTE agent (>24hrs) due to surgical blood loss or risk of bleeding: not applicable  Findings:  4cm cervix without funnelling on US just before operation.  2 cm exposed cervix intravaginal.  2 stitches placed about 3 cm up from os and KNOTS ARE LOCATED IN POSTERIOR CERVIX.    Technique:  After adequate spinal anesthesia was achieved the pt was examined. The pt was then prepped and draped in usual sterile fashion; a foley was placed and bladder emptied.  The cervix was grasped with a fenestrated rings and the bladder reflexion was injected with saline.  The reflexion of the vagina onto the cervix was then tented up and carefully incised with the scalpel and incised metzenbaums up another 2 centimeters, staying close to the cervix and away from the bladder.  A 0 double loaded Ticron was then used to go from 12:00 to 9:00 and 9:00 to 6:00 with one end and then 12:00 to 3:00 and 3:00 to 6:00 with the other end.  A second stitch was placed in the same way just superior anteriorly to the previous stitch and then at least 2 cm superior on the posterior cervix.  Both stitches were tied down tight over a small os dilator and tied on the POSTERIOR CERVIX with AIR KNOTs for  identification.  The bladder flap was closed with a running stitch of 3-0 vicryl R.    The pt tolerated the procedure well and was returned to the recovery room in stable condition.

## 2014-07-08 NOTE — Anesthesia Postprocedure Evaluation (Signed)
  Anesthesia Post-op Note  Anesthesia Post Note  Patient: Claudia Price  Procedure(s) Performed: Procedure(s) (LRB): MODIFIED MCDONALD CERCLAGE CERVICAL (N/A)  Anesthesia type: Spinal  Patient location: PACU  Post pain: Pain level controlled  Post assessment: Post-op Vital signs reviewed  Last Vitals:  Filed Vitals:   07/08/14 1415  BP: 107/47  Pulse: 78  Temp: 36.8 C  Resp: 20    Post vital signs: Reviewed  Level of consciousness: awake  Complications: No apparent anesthesia complications

## 2014-07-08 NOTE — H&P (Signed)
  [name] is 12-04-1990  Patient's last menstrual period was 03/23/2014.  She is a 23 yo G4P1021 at 14 weeks 4 days with INCOMPETENT CERVIX Pregnancy complicated by history of cervical insufficiency with last pregnancy and she underwent cerclage.  She presents today for prophylactic cerclage  PMHX  Past Medical History  Diagnosis Date  . Anxiety   . Anemia     borderline    PSURG HX  Past Surgical History  Procedure Laterality Date  . Ankle fracture surgery    . Cesarean section    . Cervical cerclage      Fam HX History reviewed. No pertinent family history.  Soc HX  History   Social History  . Marital Status: Married    Spouse Name: N/A    Number of Children: N/A  . Years of Education: N/A   Occupational History  . Not on file.   Social History Main Topics  . Smoking status: Never Smoker   . Smokeless tobacco: Never Used  . Alcohol Use: No  . Drug Use: No  . Sexual Activity: Yes   Other Topics Concern  . Not on file   Social History Narrative    ROS  Review of Systems - General ROS: negative negative for - chills, fatigue or fever Endocrine ROS: negative for - breast changes, hair pattern changes, hot flashes or palpitations Breast ROS: negative for breast lumps Respiratory ROS: no cough, shortness of breath, or wheezing Cardiovascular ROS: no chest pain or dyspnea on exertion Gastrointestinal ROS: no abdominal pain, change in bowel habits, or black or bloody stools Genito-Urinary ROS: no dysuria, trouble voiding, or hematuria Musculoskeletal ROS: negative for - joint pain, joint stiffness, joint swelling or muscle pain  US results pending  Gen WDWN  IN NAD CV RRR LUNGS CTAB ABD Gravid soft NT GU Exam deferred EXT No C/C/E  A/P: 23 yo with h/o cervical insufficiency at 14 weeks 4 days To OR for [employer] Cerclage  WSP

## 2014-07-08 NOTE — Transfer of Care (Signed)
Immediate Anesthesia Transfer of Care Note  Patient: Claudia Price  Procedure(s) Performed: Procedure(s): MODIFIED MCDONALD CERCLAGE CERVICAL (N/A)  Patient Location: PACU  Anesthesia Type:Spinal  Level of Consciousness: awake, alert  and oriented  Airway & Oxygen Therapy: Patient Spontanous Breathing and Patient connected to nasal cannula oxygen  Post-op Assessment: Report given to PACU RN and Post -op Vital signs reviewed and stable  Post vital signs: Reviewed and stable  Complications: No apparent anesthesia complications

## 2014-07-10 ENCOUNTER — Encounter (HOSPITAL_COMMUNITY): Payer: Self-pay | Admitting: Obstetrics and Gynecology

## 2014-09-02 ENCOUNTER — Other Ambulatory Visit (HOSPITAL_COMMUNITY): Payer: Self-pay | Admitting: Obstetrics & Gynecology

## 2014-09-02 DIAGNOSIS — O09292 Supervision of pregnancy with other poor reproductive or obstetric history, second trimester: Secondary | ICD-10-CM

## 2014-09-02 DIAGNOSIS — O3432 Maternal care for cervical incompetence, second trimester: Secondary | ICD-10-CM

## 2014-09-02 DIAGNOSIS — Z8751 Personal history of pre-term labor: Secondary | ICD-10-CM

## 2014-09-03 NOTE — L&D Delivery Note (Signed)
Delivery Note - VBAC Patient pushed for 30 minutes and at 1:49 PM a viable and healthy female was delivered via VBAC, Spontaneous (Presentation: Left Occiput Anterior).  APGAR: 9, 9; weight  .   Placenta status: Intact, Spontaneous.  Cord: 3 vessels with the following complications: None.    Anesthesia: Epidural  Episiotomy: None Lacerations: Labial Suture Repair: 3.0 chromic Est. Blood Loss (mL): 133  Mom to postpartum.  Baby to Couplet care / Skin to Skin.  Essie HartINN, Aaliyah Gavel STACIA 01/11/2015, 2:20 PM

## 2014-09-10 ENCOUNTER — Other Ambulatory Visit (HOSPITAL_COMMUNITY): Payer: Self-pay | Admitting: Maternal and Fetal Medicine

## 2014-09-10 ENCOUNTER — Ambulatory Visit (HOSPITAL_COMMUNITY)
Admission: RE | Admit: 2014-09-10 | Discharge: 2014-09-10 | Disposition: A | Discharge status: Home / Self Care | Payer: Medicaid Other | Source: Ambulatory Visit | Attending: Obstetrics & Gynecology | Admitting: Obstetrics & Gynecology

## 2014-09-10 ENCOUNTER — Other Ambulatory Visit (HOSPITAL_COMMUNITY): Payer: Self-pay | Admitting: Obstetrics & Gynecology

## 2014-09-10 ENCOUNTER — Encounter (HOSPITAL_COMMUNITY): Payer: Self-pay

## 2014-09-10 DIAGNOSIS — O99212 Obesity complicating pregnancy, second trimester: Secondary | ICD-10-CM

## 2014-09-10 DIAGNOSIS — O3432 Maternal care for cervical incompetence, second trimester: Secondary | ICD-10-CM

## 2014-09-10 DIAGNOSIS — Z8751 Personal history of pre-term labor: Secondary | ICD-10-CM

## 2014-09-10 DIAGNOSIS — Z3A23 23 weeks gestation of pregnancy: Secondary | ICD-10-CM | POA: Insufficient documentation

## 2014-09-10 DIAGNOSIS — O09292 Supervision of pregnancy with other poor reproductive or obstetric history, second trimester: Secondary | ICD-10-CM

## 2014-09-10 DIAGNOSIS — Z9889 Other specified postprocedural states: Secondary | ICD-10-CM

## 2014-10-08 ENCOUNTER — Other Ambulatory Visit (HOSPITAL_COMMUNITY): Payer: Self-pay | Admitting: Maternal and Fetal Medicine

## 2014-10-08 ENCOUNTER — Encounter (HOSPITAL_COMMUNITY): Payer: Self-pay

## 2014-10-08 ENCOUNTER — Ambulatory Visit (HOSPITAL_COMMUNITY)
Admission: RE | Admit: 2014-10-08 | Discharge: 2014-10-08 | Disposition: A | Discharge status: Home / Self Care | Payer: Medicaid Other | Source: Ambulatory Visit | Attending: Obstetrics & Gynecology | Admitting: Obstetrics & Gynecology

## 2014-10-08 DIAGNOSIS — Z36 Encounter for antenatal screening of mother: Secondary | ICD-10-CM | POA: Diagnosis not present

## 2014-10-08 DIAGNOSIS — O99212 Obesity complicating pregnancy, second trimester: Secondary | ICD-10-CM

## 2014-10-08 DIAGNOSIS — O09292 Supervision of pregnancy with other poor reproductive or obstetric history, second trimester: Secondary | ICD-10-CM

## 2014-10-08 DIAGNOSIS — Z8751 Personal history of pre-term labor: Secondary | ICD-10-CM

## 2014-10-08 DIAGNOSIS — O09212 Supervision of pregnancy with history of pre-term labor, second trimester: Secondary | ICD-10-CM | POA: Insufficient documentation

## 2014-10-08 DIAGNOSIS — O3432 Maternal care for cervical incompetence, second trimester: Secondary | ICD-10-CM

## 2014-10-08 DIAGNOSIS — IMO0002 Reserved for concepts with insufficient information to code with codable children: Secondary | ICD-10-CM | POA: Insufficient documentation

## 2014-10-08 DIAGNOSIS — Z0489 Encounter for examination and observation for other specified reasons: Secondary | ICD-10-CM | POA: Insufficient documentation

## 2014-10-08 DIAGNOSIS — Z3A27 27 weeks gestation of pregnancy: Secondary | ICD-10-CM | POA: Diagnosis not present

## 2014-10-08 DIAGNOSIS — Z9889 Other specified postprocedural states: Secondary | ICD-10-CM

## 2014-10-11 ENCOUNTER — Other Ambulatory Visit (HOSPITAL_COMMUNITY): Payer: Self-pay

## 2014-11-19 ENCOUNTER — Encounter (HOSPITAL_COMMUNITY): Payer: Self-pay

## 2014-11-19 ENCOUNTER — Ambulatory Visit (HOSPITAL_COMMUNITY)
Admission: RE | Admit: 2014-11-19 | Discharge: 2014-11-19 | Disposition: A | Discharge status: Home / Self Care | Payer: Medicaid Other | Source: Ambulatory Visit | Attending: Obstetrics & Gynecology | Admitting: Obstetrics & Gynecology

## 2014-11-19 DIAGNOSIS — Z3A33 33 weeks gestation of pregnancy: Secondary | ICD-10-CM | POA: Diagnosis not present

## 2014-11-19 DIAGNOSIS — O3433 Maternal care for cervical incompetence, third trimester: Secondary | ICD-10-CM | POA: Insufficient documentation

## 2014-11-19 DIAGNOSIS — O99210 Obesity complicating pregnancy, unspecified trimester: Secondary | ICD-10-CM

## 2014-11-19 DIAGNOSIS — O3421 Maternal care for scar from previous cesarean delivery: Secondary | ICD-10-CM | POA: Insufficient documentation

## 2014-11-19 DIAGNOSIS — O99213 Obesity complicating pregnancy, third trimester: Secondary | ICD-10-CM | POA: Insufficient documentation

## 2014-11-19 DIAGNOSIS — O3432 Maternal care for cervical incompetence, second trimester: Secondary | ICD-10-CM

## 2014-11-19 DIAGNOSIS — O09292 Supervision of pregnancy with other poor reproductive or obstetric history, second trimester: Secondary | ICD-10-CM

## 2014-11-19 DIAGNOSIS — Z9889 Other specified postprocedural states: Secondary | ICD-10-CM

## 2014-11-19 DIAGNOSIS — Z8751 Personal history of pre-term labor: Secondary | ICD-10-CM

## 2014-11-19 DIAGNOSIS — O99212 Obesity complicating pregnancy, second trimester: Secondary | ICD-10-CM

## 2014-11-19 NOTE — ED Notes (Signed)
Pt reports an increased thick, mucous with "sterile odor."

## 2014-12-03 LAB — OB RESULTS CONSOLE GBS: GBS: POSITIVE

## 2014-12-17 ENCOUNTER — Encounter (HOSPITAL_COMMUNITY): Payer: Self-pay | Admitting: *Deleted

## 2014-12-17 ENCOUNTER — Inpatient Hospital Stay (HOSPITAL_COMMUNITY)
Admission: AD | Admit: 2014-12-17 | Discharge: 2014-12-17 | Disposition: A | Discharge status: Home / Self Care | Payer: Medicaid Other | Source: Ambulatory Visit | Attending: Obstetrics and Gynecology | Admitting: Obstetrics and Gynecology

## 2014-12-17 DIAGNOSIS — Z3A37 37 weeks gestation of pregnancy: Secondary | ICD-10-CM | POA: Diagnosis not present

## 2014-12-17 DIAGNOSIS — O368130 Decreased fetal movements, third trimester, not applicable or unspecified: Secondary | ICD-10-CM | POA: Diagnosis not present

## 2014-12-17 NOTE — Discharge Instructions (Signed)
Third Trimester of Pregnancy The third trimester is from week 29 through week 42, months 7 through 9. The third trimester is a time when the fetus is growing rapidly. At the end of the ninth month, the fetus is about 20 inches in length and weighs 6-10 pounds.  BODY CHANGES Your body goes through many changes during pregnancy. The changes vary from woman to woman.   Your weight will continue to increase. You can expect to gain 25-35 pounds (11-16 kg) by the end of the pregnancy.  You may begin to get stretch marks on your hips, abdomen, and breasts.  You may urinate more often because the fetus is moving lower into your pelvis and pressing on your bladder.  You may develop or continue to have heartburn as a result of your pregnancy.  You may develop constipation because certain hormones are causing the muscles that push waste through your intestines to slow down.  You may develop hemorrhoids or swollen, bulging veins (varicose veins).  You may have pelvic pain because of the weight gain and pregnancy hormones relaxing your joints between the bones in your pelvis. Backaches may result from overexertion of the muscles supporting your posture.  You may have changes in your hair. These can include thickening of your hair, rapid growth, and changes in texture. Some women also have hair loss during or after pregnancy, or hair that feels dry or thin. Your hair will most likely return to normal after your baby is born.  Your breasts will continue to grow and be tender. A yellow discharge may leak from your breasts called colostrum.  Your belly button may stick out.  You may feel short of breath because of your expanding uterus.  You may notice the fetus "dropping," or moving lower in your abdomen.  You may have a bloody mucus discharge. This usually occurs a few days to a week before labor begins.  Your cervix becomes thin and soft (effaced) near your due date. WHAT TO EXPECT AT YOUR PRENATAL  EXAMS  You will have prenatal exams every 2 weeks until week 36. Then, you will have weekly prenatal exams. During a routine prenatal visit:  You will be weighed to make sure you and the fetus are growing normally.  Your blood pressure is taken.  Your abdomen will be measured to track your baby's growth.  The fetal heartbeat will be listened to.  Any test results from the previous visit will be discussed.  You may have a cervical check near your due date to see if you have effaced. At around 36 weeks, your caregiver will check your cervix. At the same time, your caregiver will also perform a test on the secretions of the vaginal tissue. This test is to determine if a type of bacteria, Group B streptococcus, is present. Your caregiver will explain this further. Your caregiver may ask you:  What your birth plan is.  How you are feeling.  If you are feeling the baby move.  If you have had any abnormal symptoms, such as leaking fluid, bleeding, severe headaches, or abdominal cramping.  If you have any questions. Other tests or screenings that may be performed during your third trimester include:  Blood tests that check for low iron levels (anemia).  Fetal testing to check the health, activity level, and growth of the fetus. Testing is done if you have certain medical conditions or if there are problems during the pregnancy. FALSE LABOR You may feel small, irregular contractions that   eventually go away. These are called Braxton Hicks contractions, or false labor. Contractions may last for hours, days, or even weeks before true labor sets in. If contractions come at regular intervals, intensify, or become painful, it is best to be seen by your caregiver.  SIGNS OF LABOR   Menstrual-like cramps.  Contractions that are 5 minutes apart or less.  Contractions that start on the top of the uterus and spread down to the lower abdomen and back.  A sense of increased pelvic pressure or back  pain.  A watery or bloody mucus discharge that comes from the vagina. If you have any of these signs before the 37th week of pregnancy, call your caregiver right away. You need to go to the hospital to get checked immediately. HOME CARE INSTRUCTIONS   Avoid all smoking, herbs, alcohol, and unprescribed drugs. These chemicals affect the formation and growth of the baby.  Follow your caregiver's instructions regarding medicine use. There are medicines that are either safe or unsafe to take during pregnancy.  Exercise only as directed by your caregiver. Experiencing uterine cramps is a good sign to stop exercising.  Continue to eat regular, healthy meals.  Wear a good support bra for breast tenderness.  Do not use hot tubs, steam rooms, or saunas.  Wear your seat belt at all times when driving.  Avoid raw meat, uncooked cheese, cat litter boxes, and soil used by cats. These carry germs that can cause birth defects in the baby.  Take your prenatal vitamins.  Try taking a stool softener (if your caregiver approves) if you develop constipation. Eat more high-fiber foods, such as fresh vegetables or fruit and whole grains. Drink plenty of fluids to keep your urine clear or pale yellow.  Take warm sitz baths to soothe any pain or discomfort caused by hemorrhoids. Use hemorrhoid cream if your caregiver approves.  If you develop varicose veins, wear support hose. Elevate your feet for 15 minutes, 3-4 times a day. Limit salt in your diet.  Avoid heavy lifting, wear low heal shoes, and practice good posture.  Rest a lot with your legs elevated if you have leg cramps or low back pain.  Visit your dentist if you have not gone during your pregnancy. Use a soft toothbrush to brush your teeth and be gentle when you floss.  A sexual relationship may be continued unless your caregiver directs you otherwise.  Do not travel far distances unless it is absolutely necessary and only with the approval  of your caregiver.  Take prenatal classes to understand, practice, and ask questions about the labor and delivery.  Make a trial run to the hospital.  Pack your hospital bag.  Prepare the baby's nursery.  Continue to go to all your prenatal visits as directed by your caregiver. SEEK MEDICAL CARE IF:  You are unsure if you are in labor or if your water has broken.  You have dizziness.  You have mild pelvic cramps, pelvic pressure, or nagging pain in your abdominal area.  You have persistent nausea, vomiting, or diarrhea.  You have a bad smelling vaginal discharge.  You have pain with urination. SEEK IMMEDIATE MEDICAL CARE IF:   You have a fever.  You are leaking fluid from your vagina.  You have spotting or bleeding from your vagina.  You have severe abdominal cramping or pain.  You have rapid weight loss or gain.  You have shortness of breath with chest pain.  You notice sudden or extreme swelling   of your face, hands, ankles, feet, or legs.  You have not felt your baby move in over an hour.  You have severe headaches that do not go away with medicine.  You have vision changes. Document Released: 08/14/2001 Document Revised: 08/25/2013 Document Reviewed: 10/21/2012 Exit[employer] Patient Information 2015 [employer]. This information is not intended to replace advice given to you by your health care provider. Make sure you discuss any questions you have with your health care provider.  

## 2014-12-17 NOTE — MAU Provider Note (Signed)
  History     CSN: [id number]  Arrival date and time: 12/17/14 1649   First Provider Initiated Contact with Patient 12/17/14 1731      Chief Complaint  Patient presents with  . Decreased Fetal Movement   HPI [name] 24 y.o. [id number] @[redacted]w[redacted]d  presents to MAU complaining of decreased fetal movement.  She states the baby always moves more around 2 am and she still feels big movements but fewer small kicks and punches over the last week or so.  She called her private office and was told to come here for monitoring.  She denies VB, LOF, dysuria, contractions, weakness, fever, headache, CP, SOB.   OB History    Gravida Para Term Preterm AB TAB SAB Ectopic Multiple Living   4 1 1  2  2   1       Past Medical History  Diagnosis Date  . Anxiety   . Anemia     borderline    Past Surgical History  Procedure Laterality Date  . Ankle fracture surgery    . Cesarean section    . Cervical cerclage    . Cervical cerclage N/A 07/08/2014    Procedure: MODIFIED MCDONALD CERCLAGE CERVICAL;  Surgeon: Loney LaurenceMichelle A Horvath, MD;  Location: [facility];  Service: Gynecology;  Laterality: N/A;    History reviewed. No pertinent family history.  History  Substance Use Topics  . Smoking status: Never Smoker   . Smokeless tobacco: Never Used  . Alcohol Use: No    Allergies: No Known Allergies  Prescriptions prior to admission  Medication Sig Dispense Refill Last Dose  . calcium carbonate (TUMS - DOSED IN MG ELEMENTAL CALCIUM) 500 MG chewable tablet Chew 2 tablets by mouth daily as needed for indigestion or heartburn.   Past Week at Unknown time  . Prenatal Vit-Fe Fumarate-FA (PRENATAL MULTIVITAMIN) TABS tablet Take 1 tablet by mouth daily at 12 noon.   12/16/2014 at Unknown time    ROS Pertinent ROS in HPI  Physical Exam   Blood pressure 129/88, pulse 119, temperature 98.1 F (36.7 C), temperature source Oral, resp. rate 18, last menstrual period 03/23/2014.  Physical Exam   Constitutional: She is oriented to person, place, and time. She appears well-developed and well-nourished. No distress.  HENT:  Head: Normocephalic and atraumatic.  Eyes: EOM are normal.  Neck: Normal range of motion.  Cardiovascular: Normal rate and regular rhythm.   Respiratory: Breath sounds normal. No respiratory distress.  GI: Soft. She exhibits no distension.  Musculoskeletal: Normal range of motion.  Neurological: She is alert and oriented to person, place, and time.  Skin: Skin is warm and dry.  Psychiatric: She has a normal mood and affect.   Fetal Tracing: Baseline:135 Variability:mod Accelerations: 15x15 Decelerations:none Toco: little irritability  MAU Course  Procedures  MDM Discussed with Dr. Tenny Crawoss whom is agreeable to discharge of pt with labor precautions and follow up in clinic as scheduled.  Assessment and Plan  A:  1. Decreased fetal movement, third trimester, not applicable or unspecified fetus    P: Discharge to home Labor precautions  Keep f/u appt Patient may return to MAU as needed or if her condition were to change or worsen   Bertram Denvereague Clark, Dynastie Knoop E 12/17/2014, 5:31 PM

## 2014-12-17 NOTE — MAU Note (Signed)
Feels that fetal movement is decreased. States she thinks the baby may in a position where she can't feel the movement as well. MD advised she come to MAU as a precaution.

## 2015-01-10 ENCOUNTER — Telehealth (HOSPITAL_COMMUNITY): Payer: Self-pay | Admitting: *Deleted

## 2015-01-10 NOTE — Telephone Encounter (Signed)
Preadmission screen  

## 2015-01-11 ENCOUNTER — Encounter (HOSPITAL_COMMUNITY): Payer: Self-pay

## 2015-01-11 ENCOUNTER — Inpatient Hospital Stay (HOSPITAL_COMMUNITY)
Admission: RE | Admit: 2015-01-11 | Discharge: 2015-01-12 | DRG: 775 | Disposition: A | Discharge status: Home / Self Care | Payer: Medicaid Other | Source: Ambulatory Visit | Attending: Obstetrics & Gynecology | Admitting: Obstetrics & Gynecology

## 2015-01-11 ENCOUNTER — Inpatient Hospital Stay (HOSPITAL_COMMUNITY): Payer: Medicaid Other | Admitting: Anesthesiology

## 2015-01-11 VITALS — BP 110/63 | HR 58 | Temp 97.7°F | Resp 18 | Ht 71.0 in | Wt 322.0 lb

## 2015-01-11 DIAGNOSIS — O99824 Streptococcus B carrier state complicating childbirth: Secondary | ICD-10-CM | POA: Diagnosis present

## 2015-01-11 DIAGNOSIS — Z3483 Encounter for supervision of other normal pregnancy, third trimester: Secondary | ICD-10-CM | POA: Diagnosis present

## 2015-01-11 DIAGNOSIS — O480 Post-term pregnancy: Principal | ICD-10-CM | POA: Diagnosis present

## 2015-01-11 DIAGNOSIS — Z3490 Encounter for supervision of normal pregnancy, unspecified, unspecified trimester: Secondary | ICD-10-CM

## 2015-01-11 DIAGNOSIS — Z3A41 41 weeks gestation of pregnancy: Secondary | ICD-10-CM | POA: Diagnosis present

## 2015-01-11 DIAGNOSIS — O99212 Obesity complicating pregnancy, second trimester: Secondary | ICD-10-CM

## 2015-01-11 DIAGNOSIS — O34219 Maternal care for unspecified type scar from previous cesarean delivery: Secondary | ICD-10-CM | POA: Diagnosis present

## 2015-01-11 LAB — CBC
HEMATOCRIT: 32.8 % — AB (ref 36.0–46.0)
HEMOGLOBIN: 10.7 g/dL — AB (ref 12.0–15.0)
MCH: 27.4 pg (ref 26.0–34.0)
MCHC: 32.6 g/dL (ref 30.0–36.0)
MCV: 84.1 fL (ref 78.0–100.0)
Platelets: 250 10*3/uL (ref 150–400)
RBC: 3.9 MIL/uL (ref 3.87–5.11)
RDW: 15.1 % (ref 11.5–15.5)
WBC: 7.6 10*3/uL (ref 4.0–10.5)

## 2015-01-11 LAB — TYPE AND SCREEN
ABO/RH(D): A POS
ANTIBODY SCREEN: NEGATIVE

## 2015-01-11 LAB — RPR: RPR: NONREACTIVE

## 2015-01-11 LAB — ABO/RH: ABO/RH(D): A POS

## 2015-01-11 MED ORDER — LACTATED RINGERS IV SOLN: INTRAVENOUS | Status: DC

## 2015-01-11 MED ORDER — BENZOCAINE-MENTHOL 20-0.5 % EX AERO
1.0000 "application " | INHALATION_SPRAY | CUTANEOUS | Status: DC | PRN
  Filled 2015-01-11: qty 56

## 2015-01-11 MED ORDER — LIDOCAINE HCL (PF) 1 % IJ SOLN
INTRAMUSCULAR | Status: DC | PRN
  Administered 2015-01-11 (×2): 4 mL

## 2015-01-11 MED ORDER — ONDANSETRON HCL 4 MG/2ML IJ SOLN: 4.0000 mg | INTRAMUSCULAR | Status: DC | PRN

## 2015-01-11 MED ORDER — TETANUS-DIPHTH-ACELL PERTUSSIS 5-2.5-18.5 LF-MCG/0.5 IM SUSP
0.5000 mL | Freq: Once | INTRAMUSCULAR | Status: DC
  Filled 2015-01-11: qty 0.5

## 2015-01-11 MED ORDER — FLEET ENEMA 7-19 GM/118ML RE ENEM: 1.0000 | ENEMA | Freq: Every day | RECTAL | Status: DC | PRN

## 2015-01-11 MED ORDER — PENICILLIN G POTASSIUM 5000000 UNITS IJ SOLR
2.5000 10*6.[IU] | INTRAVENOUS | Status: DC
  Administered 2015-01-11: 2.5 10*6.[IU] via INTRAVENOUS
  Filled 2015-01-11 (×11): qty 2.5

## 2015-01-11 MED ORDER — PRENATAL MULTIVITAMIN CH
1.0000 | ORAL_TABLET | Freq: Every day | ORAL | Status: DC
  Administered 2015-01-12: 1 via ORAL
  Filled 2015-01-11: qty 1

## 2015-01-11 MED ORDER — OXYCODONE-ACETAMINOPHEN 5-325 MG PO TABS: 2.0000 | ORAL_TABLET | ORAL | Status: DC | PRN

## 2015-01-11 MED ORDER — FENTANYL 2.5 MCG/ML BUPIVACAINE 1/10 % EPIDURAL INFUSION (WH - ANES)
14.0000 mL/h | INTRAMUSCULAR | Status: DC | PRN
  Administered 2015-01-11: 14 mL/h via EPIDURAL
  Filled 2015-01-11: qty 125

## 2015-01-11 MED ORDER — OXYCODONE-ACETAMINOPHEN 5-325 MG PO TABS: 1.0000 | ORAL_TABLET | ORAL | Status: DC | PRN

## 2015-01-11 MED ORDER — CITRIC ACID-SODIUM CITRATE 334-500 MG/5ML PO SOLN: 30.0000 mL | ORAL | Status: DC | PRN

## 2015-01-11 MED ORDER — ACETAMINOPHEN 325 MG PO TABS: 650.0000 mg | ORAL_TABLET | ORAL | Status: DC | PRN

## 2015-01-11 MED ORDER — WITCH HAZEL-GLYCERIN EX PADS
1.0000 | MEDICATED_PAD | CUTANEOUS | Status: DC | PRN
Start: 2015-01-11 — End: 2015-01-12

## 2015-01-11 MED ORDER — PENICILLIN G POTASSIUM 5000000 UNITS IJ SOLR
5.0000 10*6.[IU] | Freq: Once | INTRAVENOUS | Status: AC
  Administered 2015-01-11: 5 10*6.[IU] via INTRAVENOUS
  Filled 2015-01-11: qty 5

## 2015-01-11 MED ORDER — SIMETHICONE 80 MG PO CHEW: 80.0000 mg | CHEWABLE_TABLET | ORAL | Status: DC | PRN

## 2015-01-11 MED ORDER — ONDANSETRON HCL 4 MG PO TABS: 4.0000 mg | ORAL_TABLET | ORAL | Status: DC | PRN

## 2015-01-11 MED ORDER — IBUPROFEN 600 MG PO TABS
600.0000 mg | ORAL_TABLET | Freq: Four times a day (QID) | ORAL | Status: DC
  Administered 2015-01-11 – 2015-01-12 (×4): 600 mg via ORAL
  Filled 2015-01-11 (×4): qty 1

## 2015-01-11 MED ORDER — DIPHENHYDRAMINE HCL 25 MG PO CAPS: 25.0000 mg | ORAL_CAPSULE | Freq: Four times a day (QID) | ORAL | Status: DC | PRN

## 2015-01-11 MED ORDER — ZOLPIDEM TARTRATE 5 MG PO TABS: 5.0000 mg | ORAL_TABLET | Freq: Every evening | ORAL | Status: DC | PRN

## 2015-01-11 MED ORDER — SENNOSIDES-DOCUSATE SODIUM 8.6-50 MG PO TABS
2.0000 | ORAL_TABLET | ORAL | Status: DC
  Administered 2015-01-12: 2 via ORAL
  Filled 2015-01-11: qty 2

## 2015-01-11 MED ORDER — EPHEDRINE 5 MG/ML INJ
10.0000 mg | INTRAVENOUS | Status: DC | PRN
  Filled 2015-01-11: qty 2

## 2015-01-11 MED ORDER — PHENYLEPHRINE 40 MCG/ML (10ML) SYRINGE FOR IV PUSH (FOR BLOOD PRESSURE SUPPORT)
80.0000 ug | PREFILLED_SYRINGE | INTRAVENOUS | Status: DC | PRN
  Filled 2015-01-11: qty 20
  Filled 2015-01-11: qty 2

## 2015-01-11 MED ORDER — OXYTOCIN BOLUS FROM INFUSION: 500.0000 mL | INTRAVENOUS | Status: DC

## 2015-01-11 MED ORDER — DIPHENHYDRAMINE HCL 50 MG/ML IJ SOLN: 12.5000 mg | INTRAMUSCULAR | Status: DC | PRN

## 2015-01-11 MED ORDER — LIDOCAINE HCL (PF) 1 % IJ SOLN
30.0000 mL | INTRAMUSCULAR | Status: DC | PRN
  Filled 2015-01-11: qty 30

## 2015-01-11 MED ORDER — OXYTOCIN 40 UNITS IN LACTATED RINGERS INFUSION - SIMPLE MED: 62.5000 mL/h | INTRAVENOUS | Status: DC | PRN

## 2015-01-11 MED ORDER — DIBUCAINE 1 % RE OINT
1.0000 "application " | TOPICAL_OINTMENT | RECTAL | Status: DC | PRN
  Filled 2015-01-11: qty 28

## 2015-01-11 MED ORDER — LACTATED RINGERS IV SOLN: 500.0000 mL | INTRAVENOUS | Status: DC | PRN

## 2015-01-11 MED ORDER — OXYTOCIN 40 UNITS IN LACTATED RINGERS INFUSION - SIMPLE MED
62.5000 mL/h | INTRAVENOUS | Status: DC
  Administered 2015-01-11: 62.5 mL/h via INTRAVENOUS
  Filled 2015-01-11: qty 1000

## 2015-01-11 MED ORDER — ONDANSETRON HCL 4 MG/2ML IJ SOLN: 4.0000 mg | Freq: Four times a day (QID) | INTRAMUSCULAR | Status: DC | PRN

## 2015-01-11 MED ORDER — LANOLIN HYDROUS EX OINT: TOPICAL_OINTMENT | CUTANEOUS | Status: DC | PRN

## 2015-01-11 NOTE — Anesthesia Postprocedure Evaluation (Signed)
  Anesthesia Post-op Note  Patient: Claudia Price  Procedure(s) Performed: * No procedures listed *  Patient Location: Mother/Baby  Anesthesia Type:Epidural  Level of Consciousness: awake, alert  and oriented  Airway and Oxygen Therapy: Patient Spontanous Breathing  Post-op Pain: none  Post-op Assessment: Post-op Vital signs reviewed  Post-op Vital Signs: Reviewed and stable  Last Vitals:  Filed Vitals:   01/11/15 1630  BP: 129/61  Pulse: 80  Temp: 37.3 C  Resp: 16    Complications: No apparent anesthesia complications

## 2015-01-11 NOTE — Lactation Note (Signed)
This note was copied from the chart of Claudia Price. Lactation Consultation Note Experienced BF mom of her 2 yr. Old for 2 1/2 yrs. Mom states she had great milk supply and didn't have any issues. Mom has her own DEBP. Asked for #27 flanges. Mom has large pendulum breast w/nipples at end of soft breast. Small nipples that evert out well for very compressible latch. Note wide space between breast. Mom states she has good colostrum. States this baby latching well. Mom encouraged to feed baby 8-12 times/24 hours and with feeding cues. Mom encouraged to waken baby for feeds. Mom encouraged to do skin-to-skin. Referred to Baby and Me Book in Breastfeeding section Pg. 22-23 for position options and Proper latch demonstration. Educated about newborn behavior. [facility] brochure given w/resources, support groups and LC services. Patient Name: Claudia Price [id number]'s Date: 01/11/2015 Reason for consult: Initial assessment   Maternal Data Has patient been taught Hand Expression?: Yes Does the patient have breastfeeding experience prior to this delivery?: Yes  Feeding Feeding Type: Breast Fed Length of feed: 10 min (still BF)  LATCH Score/Interventions Latch: Grasps breast easily, tongue down, lips flanged, rhythmical sucking.  Audible Swallowing: A few with stimulation Intervention(s): Skin to skin;Hand expression;Alternate breast massage  Type of Nipple: Everted at rest and after stimulation  Comfort (Breast/Nipple): Soft / non-tender     Hold (Positioning): No assistance needed to correctly position infant at breast. Intervention(s): Breastfeeding basics reviewed;Support Pillows;Position options;Skin to skin  LATCH Score: 9  Lactation Tools Discussed/Used     Consult Status Consult Status: PRN Date: 01/12/15 Follow-up type: In-patient    Charyl DancerCARVER, Dakai Braithwaite G 01/11/2015, 6:52 PM

## 2015-01-11 NOTE — Anesthesia Preprocedure Evaluation (Signed)
Anesthesia Evaluation  Patient identified by MRN, date of birth, ID band Patient awake    Reviewed: Allergy & Precautions, NPO status , Patient's Chart, lab work & pertinent test results  Airway Mallampati: III  TM Distance: >3 FB Neck ROM: Full    Dental no notable dental hx. (+) Dental Advisory Given   Pulmonary neg pulmonary ROS,  breath sounds clear to auscultation  Pulmonary exam normal       Cardiovascular negative cardio ROS Normal cardiovascular examRhythm:Regular Rate:Normal     Neuro/Psych PSYCHIATRIC DISORDERS Anxiety negative neurological ROS     GI/Hepatic negative GI ROS, Neg liver ROS,   Endo/Other  Morbid obesity  Renal/GU negative Renal ROS  negative genitourinary   Musculoskeletal negative musculoskeletal ROS (+)   Abdominal   Peds negative pediatric ROS (+)  Hematology negative hematology ROS (+)   Anesthesia Other Findings   Reproductive/Obstetrics (+) Pregnancy                             Anesthesia Physical Anesthesia Plan  ASA: III  Anesthesia Plan: Epidural   Post-op Pain Management:    Induction:   Airway Management Planned:   Additional Equipment:   Intra-op Plan:   Post-operative Plan:   Informed Consent: I have reviewed the patients History and Physical, chart, labs and discussed the procedure including the risks, benefits and alternatives for the proposed anesthesia with the patient or authorized representative who has indicated his/her understanding and acceptance.   Dental advisory given  Plan Discussed with: CRNA  Anesthesia Plan Comments:         Anesthesia Quick Evaluation

## 2015-01-11 NOTE — Anesthesia Procedure Notes (Signed)
Epidural Patient location during procedure: OB Start time: 01/11/2015 10:45 AM  Staffing Anesthesiologist: Karie SchwalbeJUDD, Levon Boettcher Performed by: anesthesiologist   Preanesthetic Checklist Completed: patient identified, site marked, surgical consent, pre-op evaluation, timeout performed, IV checked, risks and benefits discussed and monitors and equipment checked  Epidural Patient position: sitting Prep: site prepped and draped and DuraPrep Patient monitoring: continuous pulse ox and blood pressure Approach: midline Location: L3-L4 Injection technique: LOR air  Needle:  Needle type: Tuohy  Needle gauge: 17 G Needle length: 9 cm and 9 Needle insertion depth: 10 cm Catheter type: closed end flexible Catheter size: 19 Gauge Catheter at skin depth: 14 cm Test dose: negative  Assessment Events: blood not aspirated, injection not painful, no injection resistance, negative IV test and no paresthesia  Additional Notes Patient identified. Risks/Benefits/Options discussed with patient including but not limited to bleeding, infection, nerve damage, paralysis, failed block, incomplete pain control, headache, blood pressure changes, nausea, vomiting, reactions to medication both or allergic, itching and postpartum back pain. Confirmed with bedside nurse the patient's most recent platelet count. Confirmed with patient that they are not currently taking any anticoagulation, have any bleeding history or any family history of bleeding disorders. Patient expressed understanding and wished to proceed. All questions were answered. Sterile technique was used throughout the entire procedure. Please see nursing notes for vital signs. Test dose was given through epidural catheter and negative prior to continuing to dose epidural or start infusion. Warning signs of high block given to the patient including shortness of breath, tingling/numbness in hands, complete motor block, or any concerning symptoms with instructions to  call for help. Patient was given instructions on fall risk and not to get out of bed. All questions and concerns addressed with instructions to call with any issues or inadequate analgesia.

## 2015-01-11 NOTE — H&P (Signed)
Claudia Price is a 24 y.o. female presenting for induction of labor for post dates at 41 weeks 2 days. She has regular ctx, no vaginal bleeding, no leaking of fluid, notes good fetal movements.  Maternal Medical History:  Reason for admission: Contractions.  Nausea.  Contractions: Onset was 3-5 hours ago.   Frequency: regular.   Duration is approximately 60 seconds.   Perceived severity is moderate.    Fetal activity: Perceived fetal activity is normal.   Last perceived fetal movement was within the past 12 hours.    Prenatal complications: no prenatal complications Prenatal Complications - Diabetes: none.    OB History    Gravida Para Term Preterm AB TAB SAB Ectopic Multiple Living   4 1 1  2  2   1      Past Medical History  Diagnosis Date  . Anxiety   . Anemia     borderline   Past Surgical History  Procedure Laterality Date  . Ankle fracture surgery    . Cesarean section    . Cervical cerclage    . Cervical cerclage N/A 07/08/2014    Procedure: MODIFIED MCDONALD CERCLAGE CERVICAL;  Surgeon: Loney LaurenceMichelle A Horvath, MD;  Location: [facility];  Service: Gynecology;  Laterality: N/A;   Family History: family history is not on file. Social History:  reports that she has never smoked. She quit smokeless tobacco use about 4 years ago. She reports that she does not drink alcohol or use illicit drugs.   Prenatal Transfer Tool  Maternal Diabetes: No Genetic Screening: Normal Maternal Ultrasounds/Referrals: Normal Fetal Ultrasounds or other Referrals:  None Maternal Substance Abuse:  No Significant Maternal Medications:  None Significant Maternal Lab Results:  Lab values include: Group B Strep positive Other Comments:  None  Review of Systems  Constitutional: Negative for fever and chills.  HENT: Negative for hearing loss.   Eyes: Negative for blurred vision and double vision.  Respiratory: Negative for cough and hemoptysis.   Cardiovascular: Negative for chest pain and  palpitations.  Gastrointestinal: Negative for heartburn and nausea.  Genitourinary: Negative for dysuria.  Musculoskeletal: Positive for myalgias. Negative for neck pain.  Skin: Negative for rash.  Neurological: Negative for dizziness, tingling and headaches.  Endo/Heme/Allergies: Does not bruise/bleed easily.  Psychiatric/Behavioral: Negative for depression and suicidal ideas.  All other systems reviewed and are negative.   Dilation: 10 Effacement (%): 80 Station: +2 Exam by:: Dr.Christl Fessenden Blood pressure 130/77, pulse 103, resp. rate 20, height 5\' 11"  (1.803 m), weight 146.058 kg (322 lb), last menstrual period 03/23/2014, SpO2 100 %. Maternal Exam:  Uterine Assessment: Contraction strength is moderate.  Contraction duration is 60 seconds. Contraction frequency is irregular.   Abdomen: Patient reports no abdominal tenderness. Fundal height is 40 cm.   Estimated fetal weight is 3500 grams.   Fetal presentation: vertex  Introitus: Normal vulva. Normal vagina.  Ferning test: not done.  Nitrazine test: not done. Amniotic fluid character: not assessed.  Pelvis: adequate for delivery.   Cervix: Cervix evaluated by digital exam.   On admission 5/90/-1  Fetal Exam Fetal Monitor Review: Baseline rate: 140.  Variability: moderate (6-25 bpm).   Pattern: no decelerations and accelerations present.    Fetal State Assessment: Category I - tracings are normal.     Physical Exam  Constitutional: She is oriented to person, place, and time. She appears well-developed and well-nourished.  HENT:  Head: Normocephalic and atraumatic.  Eyes: Pupils are equal, round, and reactive to light.  Cardiovascular: Normal  rate and regular rhythm.   Respiratory: Effort normal and breath sounds normal.  GI: Soft.  Genitourinary: Vagina normal.  Musculoskeletal: Normal range of motion.  Neurological: She is alert and oriented to person, place, and time. She has normal reflexes.    Prenatal labs: ABO,  Rh: --/--/A POS, A POS (05/10 0920) Antibody: NEG (05/10 0920) Rubella: Immune (11/02 0000) RPR: Nonreactive (11/02 0000)  HBsAg: Negative (11/02 0000)  HIV: Non-reactive (11/02 0000)  GBS: Positive (04/01 0000)   Assessment/Plan: 24 yo G4P1021 SIUP at 41 weeks 2 days in early active labor, she is a  TOLAC Patient already counseled about Risks of TOLAC and she previously signed a VBAC consent form  Patient desires an epidural  Will AROM after PCN dosing. Active management of labor    Claudia Price, Claudia Price 01/11/2015, 2:07 PM

## 2015-01-12 LAB — CBC
HCT: 28.4 % — ABNORMAL LOW (ref 36.0–46.0)
Hemoglobin: 9.1 g/dL — ABNORMAL LOW (ref 12.0–15.0)
MCH: 27.2 pg (ref 26.0–34.0)
MCHC: 32 g/dL (ref 30.0–36.0)
MCV: 85 fL (ref 78.0–100.0)
PLATELETS: 244 10*3/uL (ref 150–400)
RBC: 3.34 MIL/uL — ABNORMAL LOW (ref 3.87–5.11)
RDW: 15.4 % (ref 11.5–15.5)
WBC: 10 10*3/uL (ref 4.0–10.5)

## 2015-01-12 MED ORDER — IBUPROFEN 600 MG PO TABS: 600.0000 mg | ORAL_TABLET | Freq: Four times a day (QID) | ORAL | Status: DC | PRN

## 2015-01-12 MED ORDER — OXYCODONE-ACETAMINOPHEN 5-325 MG PO TABS: 1.0000 | ORAL_TABLET | ORAL | Status: DC | PRN

## 2015-01-12 MED ORDER — DOCUSATE SODIUM 100 MG PO CAPS: 100.0000 mg | ORAL_CAPSULE | Freq: Two times a day (BID) | ORAL | Status: DC

## 2015-01-12 NOTE — Discharge Summary (Signed)
Obstetric Discharge Summary Reason for Admission: onset of labor Prenatal Procedures: cerclage, NST and ultrasound Intrapartum Procedures: spontaneous vaginal delivery Postpartum Procedures: none Complications-Operative and Postpartum: 1st degree perineal laceration HEMOGLOBIN  Date Value Ref Range Status  01/12/2015 9.1* 12.0 - 15.0 g/dL Final   HCT  Date Value Ref Range Status  01/12/2015 28.4* 36.0 - 46.0 % Final    Physical Exam:  General: alert, cooperative and appears stated age 23Lochia: appropriate Uterine Fundus: firm  Discharge Diagnoses: Term Pregnancy-delivered and Post-date pregnancy  Discharge Information: Date: 01/12/2015 Activity: pelvic rest Diet: routine Medications: Ibuprofen, Colace and Percocet Condition: improved Instructions: refer to practice specific booklet Discharge to: home Follow-up Information    Follow up with PINN, Sanjuana MaeWALDA STACIA, MD In 4 weeks.   Specialty:  Obstetrics and Gynecology   Why:  for a postpartum evaluation   Contact information:   [address] [place] [id number] [phone]       Newborn Data: Live born female  Birth Weight: 8 lb 11.5 oz (3955 g) APGAR: 9, 9  Home with mother.  Asier Desroches H. 01/12/2015, 10:33 AM

## 2015-01-12 NOTE — Lactation Note (Signed)
This note was copied from the chart of Claudia Kahleah Crass. Lactation Consultation Note  Patient Name: Claudia Price [id number] Date: 01/12/2015 Reason for consult: Follow-up assessment (per mom recently breast fed , baby sleeping next to mom in bed , mom awake )  Baby is 44 hrs old and mom and dad desire early D/C. According the doc flow sheets baby has been consistent at the breast. Voids and stools adequate for age. Per mom breast feeding is going well and baby just finished breast feeding at 1320 for 20 mins. MBU RN , Melissa felt the baby was latching well also, mom just needed review of positioning for alignment.  Baby had been breast feeding with clothes and blanket, LC reminded mom the importance of skin to skin feedings until the baby can stay awake.  Mom denies sore ness. Lc reviewed sore nipple and engorgement prevention and tx if needed. Mom requested a DEBP set up due having a DEBP  at home she borrowed and doesn't have the correct pieces. LC reviewed set up. Mother informed of post-discharge support and given phone number to the lactation department, including services for phone call assistance;  out-patient appointments; and breastfeeding support group. List of other breastfeeding resources in the community given in the handout. Encouraged  mother to call for problems or concerns related to breastfeeding.   Maternal Data    Feeding Feeding Type:  (recently BF ) Length of feed: 25 min  LATCH Score/Interventions                Intervention(s): Breastfeeding basics reviewed     Lactation Tools Discussed/Used Tools: Pump Breast pump type: Double-Electric Breast Pump (mom requested DEBP kit for a borrowed pump , due to not having the correct interchangable pieces )   Consult Status Consult Status: Complete Date: 01/12/15    Myer Haff 01/12/2015, 2:29 PM

## 2015-01-12 NOTE — Progress Notes (Signed)
CLINICAL SOCIAL WORK MATERNAL/CHILD NOTE  Patient Details  Name: Claudia Price MRN: [id number] Date of Birth: 01/11/2017  Date:  01/12/2015  Clinical Social Worker Initiating Note:  Lucita Ferrara, LCSW Date/ Time Initiated:  01/12/15/1030     Child's Name:  Claudia Price   Legal Guardian:  Tobin Chad and Doren Custard   Need for Interpreter:  None   Date of Referral:  01/11/15     Reason for Referral:  History of anxiety  Referral Source:  [facility]   Address:  [address] [place], [place] [address]  Phone number:  [phone]   Household Members:  [place] (37 year old son, Erlene Quan), Spouse, Relatives (MGM)   Natural Supports (not living in the home):  Extended Family, Immediate Family   Professional Supports: None   Financial Resources:  Medicaid   Other Resources:  Physicist, medical , [place] Considerations Which May Impact Care:  None reported  Strengths:  Home prepared for child , Pediatrician chosen , Ability to meet basic needs    Risk Factors/Current Problems:   1) Family/Relationship Issues  2) Mental Health Concerns    Cognitive State:  Able to Concentrate , Alert , Linear Thinking , Goal Oriented    Mood/Affect:  Bright , Calm , Comfortable    CSW Assessment:  MOB and FOB presented as easily engaged and receptive to the visit. CSW shared reason for visit, and MOB and FOB immediately expressed appreciation.  FOB had tendency to take over the conversation, but he was easily re-directed in order to allow the MOB an opportunity to speak and process how she is feeling.  MOB and FOB were attentive to the infant and presented as motivated and interested in MOB's mental health concerns that may impact the transition to the postpartum period.   CSW assisted the MOB and FOB to process the numerous feelings and stressors that they have experienced during the pregnancy.  MOB and FOB stated that they moved in February in order to live  with the MOB's mother and grandmother. They stated that they act as a caregiver to the MOB's mother since she has MS.  They endorsed feelings of significant stress as they parent the MOB's first son, prepare for this infant, and care for a parent.  MOB and FOB expressed normative range in feelings, and were open and honest about how it has impacted their mental health.  MOB recognized that she would often become overwhelmed and overstimulated.  The FOB shared that he attempted to protect her and give her a break during those moments.  MOB denied having panic attacks, but stated that she would need to be by herself in order to have some personal time to self-regulate.  CSW attempted to gather more information on symptoms, and the MOB denied symptoms of depression.  Her comments highlighted that she was overwhelmed and stress, but it was unclear if she met criteria for symptoms of anxiety.    The MOB and FOB endorsed interest in addressing MOB's mental health concerns. They inquired about medications while breastfeeding, and CSW stated that there are options.  MOB and FOB also expressed interest in therapy in order to assist the MOB to increase and to strengthen her coping skills.  CSW discussed ability to inquire about medications while at the hospital with her OB or to refer to outpatient mental health providers.  MOB voiced desire to wait since she is already overwhelmed, tired, and feels that it would be best to  wait until she feels less overwhelmed with the transition to the postpartum period.  She presented with insight on need to follow up with a mental health provider soon since she is at an increased risk for postpartum mood disorders.   CSW facilitated a conversation surrounding MOB's preferences on how she needs/wants to be supported by the FOB. MOB openly discussed her preferences and needs, and the FOB appeared interest in hearing MOB's feedback. He recognized that it can be difficult to hear feedback  at times, but shared that he is genuinely interested in how to best support MOB.  CSW reviewed concepts of positive intention and how to utilize positive intent when interacting with one another.  CSW also reviewed how emotions can impact ability to problem solve. MOB and FOB presented with acute awareness of need to self-regulate prior to problem solving when they experience differing of opinions.  MOB and FOB stated that they are committed to increasing communication between the two  MOB and FOB denied additional questions, concerns, or needs at this time. MOB and FOB again expressed appreciation for the visit and referral information.   CSW Plan/Description:  1) Information/Referral to [employer] Resources: Perinatal mental health resources, including referrals for therapy and medication management 2) Patient/Family Education: Postpartum depression 3)No Further Intervention Required/No Barriers to Discharge   Sharyl Nimrod 01/12/2015, 11:42 AM

## 2015-01-12 NOTE — Progress Notes (Addendum)
Mom stated if antibiotics are ordered after LD than she does not feel comfortable taking them, she stated she felt as though she received enough antibiotics during her Labor and delivery for her group B, and if doctor stated she needed extra medication than she was to speak with the OB in person to clarify before taking more.

## 2015-01-12 NOTE — Progress Notes (Signed)
Post Partum Day 1 Subjective: no complaints, up ad lib, voiding and tolerating PO  Objective: Blood pressure 110/63, pulse 58, temperature 97.7 F (36.5 C), temperature source Oral, resp. rate 18, height 5\' 11"  (1.803 m), weight 146.058 kg (322 lb), last menstrual period 03/23/2014, SpO2 99 %, unknown if currently breastfeeding.  Physical Exam:  General: alert, cooperative and appears stated age Lochia: appropriate Uterine Fundus: firm   Recent Labs  01/11/15 0800 01/12/15 0551  HGB 10.7* 9.1*  HCT 32.8* 28.4*    Assessment/Plan: Discharge home and Breastfeeding  Pt desires early discharge today. Will D/C home provided pediatrician releases baby   LOS: 1 day   Mykelle Cockerell H. 01/12/2015, 10:31 AM

## 2015-07-06 IMAGING — US US EXTREM LOW VENOUS BILAT
1 series · 13 of 24 positions shown · non-contrast
Comparison: None.

CLINICAL DATA: Evaluate leg swelling, right greater than left.



[Series 1: us extrem low venous bilat · 0.09mm/px · 13 of 64 slices shown]
[im 1/64]
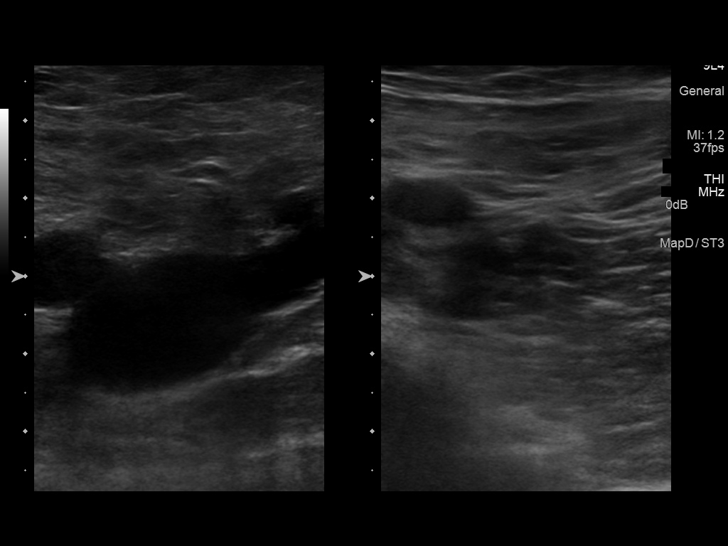
[im 6/64]
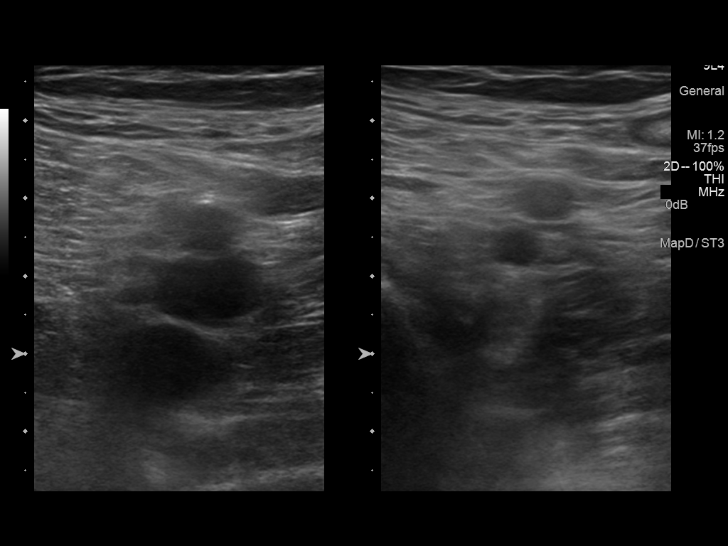
[im 11/64]
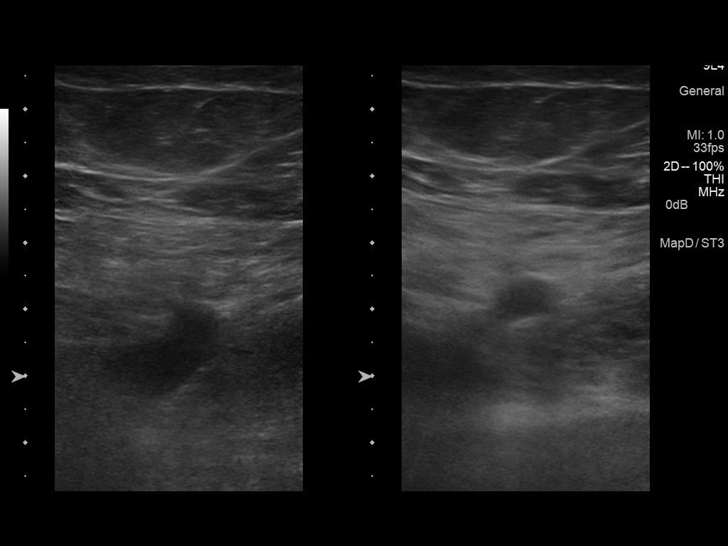
[im 17/64]
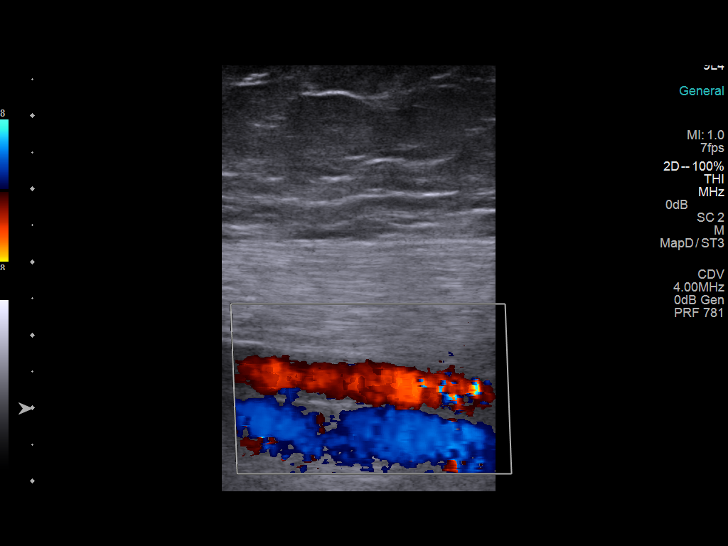
[im 22/64]
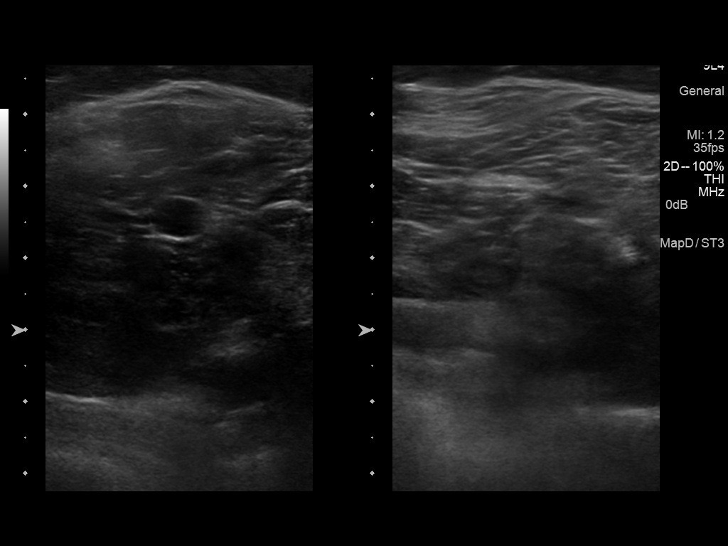
[im 28/64]
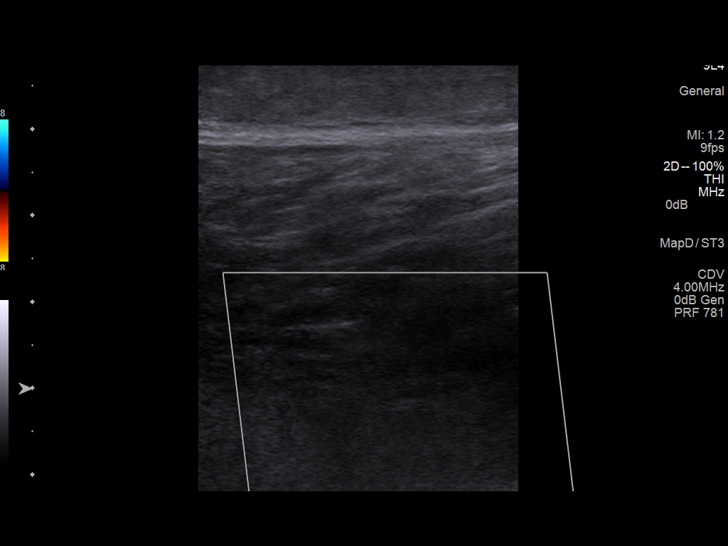
[im 33/64]
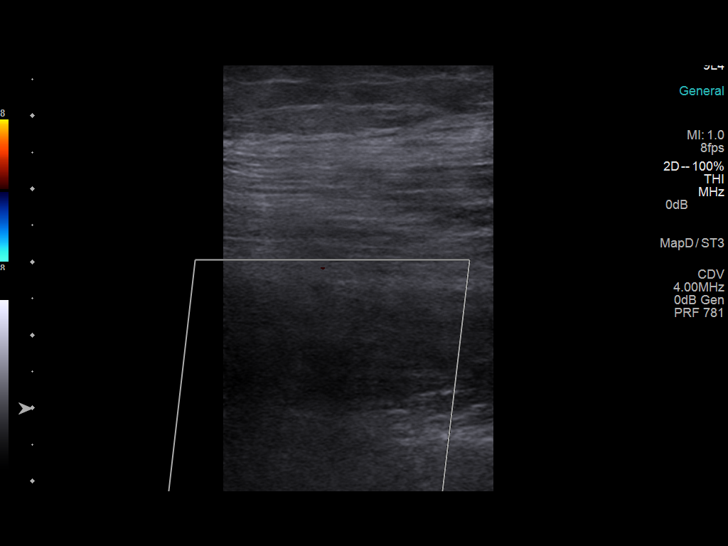
[im 36/64]
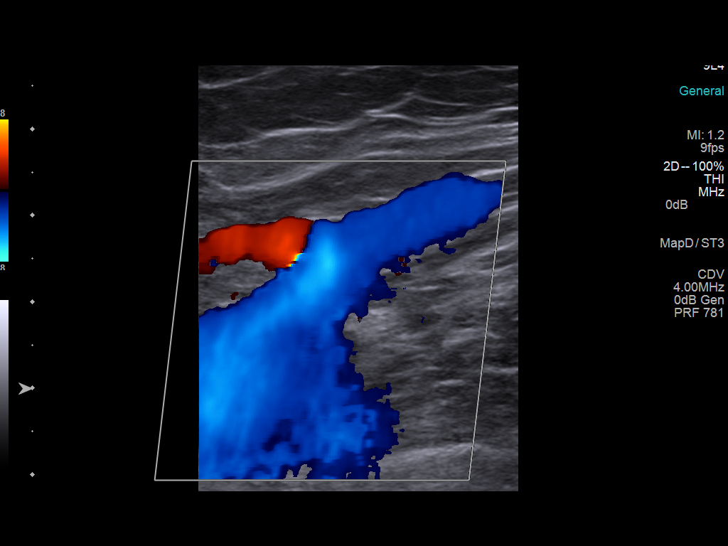
[im 42/64]
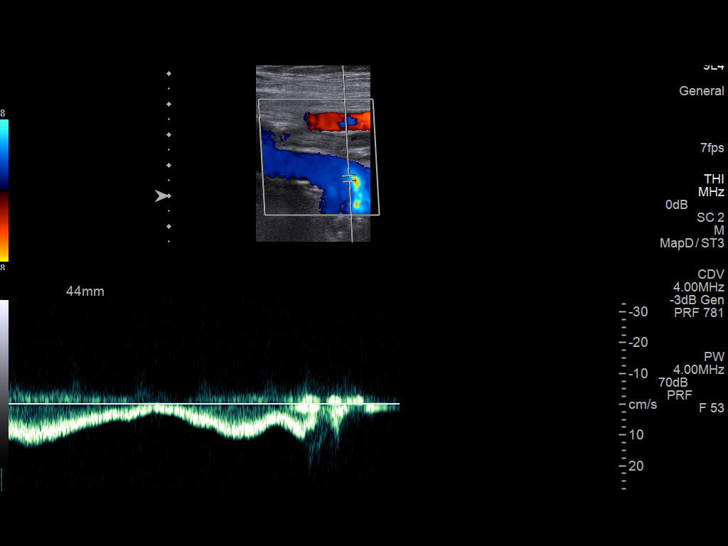
[im 47/64]
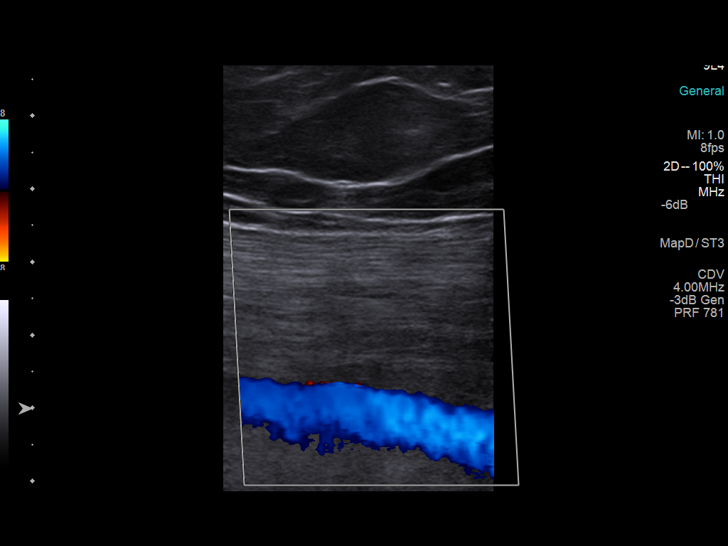
[im 53/64]
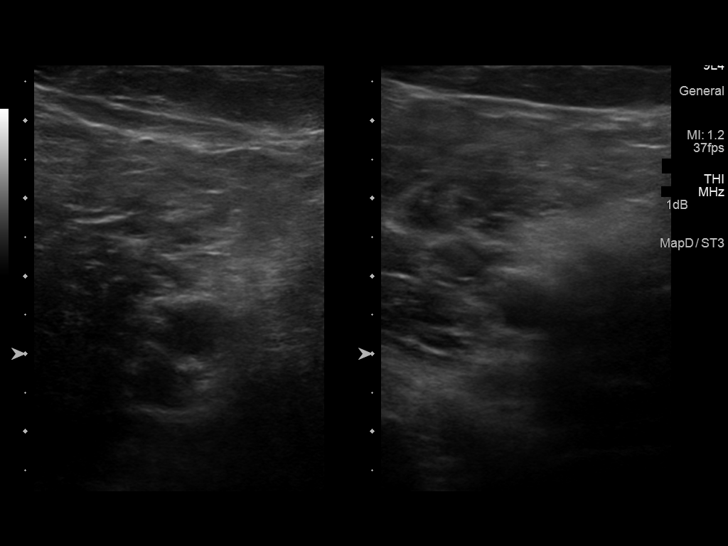
[im 58/64]
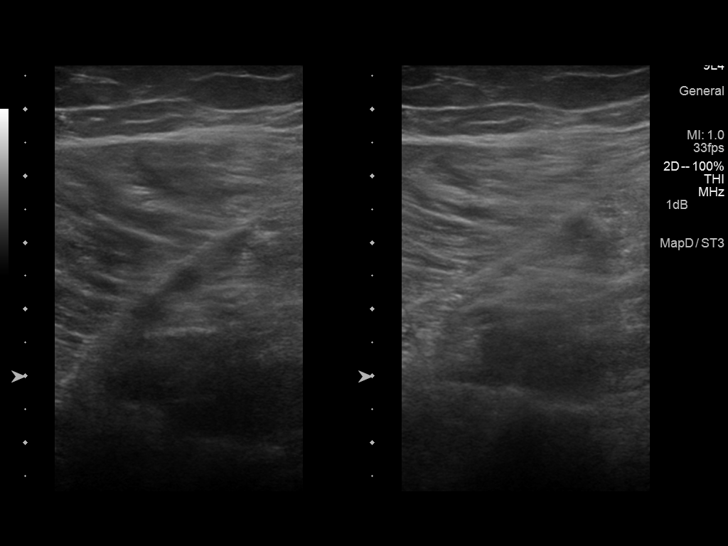
[im 64/64]
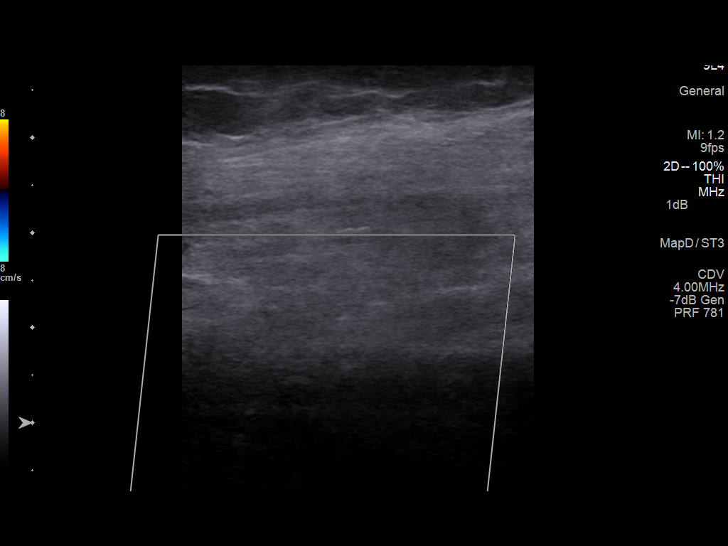

[13 of 24 positions shown; findings below may reference images not displayed]

FINDINGS: RIGHT LOWER EXTREMITY

Common Femoral Vein: No evidence of thrombus. Normal
compressibility, respiratory phasicity and response to augmentation.

Saphenofemoral Junction: No evidence of thrombus. Normal
compressibility and flow on color Doppler imaging.

Profunda Femoral Vein: No evidence of thrombus. Normal
compressibility and flow on color Doppler imaging.

Femoral Vein: No evidence of thrombus. Normal compressibility,
respiratory phasicity and response to augmentation.

Popliteal Vein: No evidence of thrombus. Normal compressibility,
respiratory phasicity and response to augmentation.

Calf Veins: No evidence of thrombus. Normal compressibility and flow
on color Doppler imaging.

Superficial Great Saphenous Vein: No evidence of thrombus. Normal
compressibility and flow on color Doppler imaging.

Venous Reflux:  None.

Other Findings:  None.

LEFT LOWER EXTREMITY

Common Femoral Vein: No evidence of thrombus. Normal
compressibility, respiratory phasicity and response to augmentation.

Saphenofemoral Junction: No evidence of thrombus. Normal
compressibility and flow on color Doppler imaging.

Profunda Femoral Vein: No evidence of thrombus. Normal
compressibility and flow on color Doppler imaging.

Femoral Vein: No evidence of thrombus. Normal compressibility,
respiratory phasicity and response to augmentation.

Popliteal Vein: No evidence of thrombus. Normal compressibility,
respiratory phasicity and response to augmentation.

Calf Veins: No evidence of thrombus. Normal compressibility and flow
on color Doppler imaging.

Superficial Great Saphenous Vein: No evidence of thrombus. Normal
compressibility and flow on color Doppler imaging.

Venous Reflux:  None.

Other Findings:  None.
IMPRESSION: No evidence of deep venous thrombosis.

## 2018-04-20 ENCOUNTER — Encounter (HOSPITAL_COMMUNITY): Payer: Self-pay | Admitting: Emergency Medicine

## 2018-04-20 ENCOUNTER — Ambulatory Visit (HOSPITAL_COMMUNITY)
Admission: EM | Admit: 2018-04-20 | Discharge: 2018-04-20 | Disposition: A | Discharge status: Home / Self Care | Payer: Medicaid Other | Attending: Family Medicine | Admitting: Family Medicine

## 2018-04-20 DIAGNOSIS — K047 Periapical abscess without sinus: Secondary | ICD-10-CM | POA: Diagnosis not present

## 2018-04-20 HISTORY — DX: Disorder of thyroid, unspecified: E07.9

## 2018-04-20 MED ORDER — PENICILLIN V POTASSIUM 500 MG PO TABS: 500.0000 mg | ORAL_TABLET | Freq: Four times a day (QID) | ORAL | 1 refills | Status: AC

## 2018-04-20 MED ORDER — IBUPROFEN 800 MG PO TABS: 800.0000 mg | ORAL_TABLET | Freq: Three times a day (TID) | ORAL | 0 refills | Status: DC

## 2018-04-20 NOTE — ED Notes (Signed)
Bed: UC01 Expected date: 04/20/18 Expected time:  Means of arrival:  Comments: For APPTS

## 2018-04-20 NOTE — ED Triage Notes (Signed)
Pt c/o swelling in her L lower jaw x2 weeks. Pt unable to see her dentist at the moment.

## 2018-04-20 NOTE — ED Provider Notes (Signed)
[facility]    CSN: [id number] Arrival date & time: 04/20/18  1423     History   Chief Complaint Chief Complaint  Patient presents with  . Appointment    230  . Facial Swelling  . Dental Pain    HPI Claudia Price is a 27 y.o. female.   HPI  Patient has pain and swelling in the left jaw.  Is been present for about 2 weeks.  Initially it was larger but now is getting smaller.  It just is not going away.  Is been very painful at times.  She is noticed that there is a draining bump on the inside of her tooth line.  The involved tooth is had a root canal and a crown.  Tooth is not painful.  The jaw is painful.  No fever chills.  No sore throat.  No difficulty chewing or swallowing.  She does have a dentist but is been unable to get into see them.  Past Medical History:  Diagnosis Date  . Anemia    borderline  . Anxiety   . Thyroid disease     Patient Active Problem List   Diagnosis Date Noted  . Vaginal birth after cesarean (VBAC) 01/11/2015  . [redacted] weeks gestation of pregnancy   . Maternal morbid obesity, antepartum (HCC)   . [redacted] weeks gestation of pregnancy   . Cervical insufficiency during pregnancy in second trimester, antepartum   . Maternal morbid obesity in second trimester, antepartum (HCC)   . Evaluate anatomy not seen on prior sonogram   . Obesity affecting pregnancy in second trimester, antepartum   . [redacted] weeks gestation of pregnancy   . [redacted] weeks gestation of pregnancy   . Cervical insufficiency during pregnancy, antepartum   . History of pregnancy loss in prior pregnancy, currently pregnant   . Pregnant state, incidental   . Cervical incompetence, antepartum condition or complication   . Examination to determine fetal viability of pregnancy   . Cervical cerclage suture present   . Right leg swelling 02/14/2014  . Leg pain, right 02/14/2014    Past Surgical History:  Procedure Laterality Date  . ANKLE FRACTURE SURGERY    . CERVICAL CERCLAGE     . CERVICAL CERCLAGE N/A 07/08/2014   Procedure: MODIFIED MCDONALD CERCLAGE CERVICAL;  Surgeon: Loney Laurence, MD;  Location: [facility];  Service: Gynecology;  Laterality: N/A;  . CESAREAN SECTION      OB History    Gravida  4   Para  2   Term  2   Preterm      AB  2   Living  1     SAB  2   TAB      Ectopic      Multiple  0   Live Births  1            Home Medications    Prior to Admission medications   Medication Sig Start Date End Date Taking? Authorizing Provider  levothyroxine (SYNTHROID, LEVOTHROID) 150 MCG tablet Take 150 mcg by mouth daily before breakfast.   Yes [provider]  ibuprofen (ADVIL,MOTRIN) 800 MG tablet Take 1 tablet (800 mg total) by mouth 3 (three) times daily. 04/20/18   Eustace Moore, MD  penicillin v potassium (VEETID) 500 MG tablet Take 1 tablet (500 mg total) by mouth 4 (four) times daily for 7 days. 04/20/18 04/27/18  Eustace Moore, MD  Prenatal Vit-Fe Fumarate-FA (PRENATAL MULTIVITAMIN) TABS tablet  Take 1 tablet by mouth daily at 12 noon.    [provider]    Family History No family history on file.  Social History Social History   Tobacco Use  . Smoking status: Never Smoker  . Smokeless tobacco: Former Engineer, waterUser  Substance Use Topics  . Alcohol use: No  . Drug use: No     Allergies   Dairy aid [lactase]; Gluten meal; and Soy allergy   Review of Systems Review of Systems  Constitutional: Negative for chills and fever.  HENT: Positive for dental problem. Negative for ear pain and sore throat.   Eyes: Negative for pain and visual disturbance.  Respiratory: Negative for cough and shortness of breath.   Cardiovascular: Negative for chest pain and palpitations.  Gastrointestinal: Negative for abdominal pain and vomiting.  Genitourinary: Negative for dysuria and hematuria.  Musculoskeletal: Negative for arthralgias and back pain.  Skin: Negative for color change and rash.  Neurological:  Positive for facial asymmetry. Negative for seizures and syncope.  All other systems reviewed and are negative.    Physical Exam Triage Vital Signs ED Triage Vitals [04/20/18 1450]  Enc Vitals Group     BP 107/77     Pulse Rate 66     Resp 16     Temp 98.4 F (36.9 C)     Temp src      SpO2 99 %     Weight      Height      Head Circumference      Peak Flow      Pain Score      Pain Loc      Pain Edu?      Excl. in GC?    No data found.  Updated Vital Signs BP 107/77   Pulse 66   Temp 98.4 F (36.9 C)   Resp 16   SpO2 99%       Physical Exam  Constitutional: She appears well-developed and well-nourished. No distress.  HENT:  Head: Normocephalic and atraumatic.    Right Ear: External ear normal.  Left Ear: External ear normal.  Mouth/Throat: Oropharynx is clear and moist. Dental abscesses present.    Facial swelling on left  Eyes: Pupils are equal, round, and reactive to light. Conjunctivae are normal.  Neck: Normal range of motion.  Cardiovascular: Normal rate, regular rhythm and normal heart sounds.  Pulmonary/Chest: Effort normal and breath sounds normal. No respiratory distress.  Abdominal: Soft. She exhibits no distension.  Musculoskeletal: Normal range of motion. She exhibits no edema.  Lymphadenopathy:    She has cervical adenopathy.  Neurological: She is alert.  Skin: Skin is warm and dry.  Psychiatric: She has a normal mood and affect. Her behavior is normal.     UC Treatments / Results  Labs (all labs ordered are listed, but only abnormal results are displayed) Labs Reviewed - No data to display  EKG None  Radiology No results found.  Procedures Procedures (including critical care time)  Medications Ordered in UC Medications - No data to display  Initial Impression / Assessment and Plan / UC Course  I have reviewed the triage vital signs and the nursing notes.  Pertinent labs & imaging results that were available during my care  of the patient were reviewed by me and considered in my medical decision making (see chart for details).      Final Clinical Impressions(s) / UC Diagnoses   Final diagnoses:  Dental abscess  Discharge Instructions     Penicillin 4 times a day as directed.  Repeat if needed. Take ibuprofen 3 times a day for pain.  Take with food. Call your dentist tomorrow to schedule a follow-up.   ED Prescriptions    Medication Sig Dispense Auth. Provider   penicillin v potassium (VEETID) 500 MG tablet Take 1 tablet (500 mg total) by mouth 4 (four) times daily for 7 days. 28 tablet Eustace MooreNelson, Deshayla Empson Sue, MD   ibuprofen (ADVIL,MOTRIN) 800 MG tablet Take 1 tablet (800 mg total) by mouth 3 (three) times daily. 21 tablet Eustace MooreNelson, Giovannie Scerbo Sue, MD     Controlled Substance Prescriptions [place] Controlled Substance Registry consulted? Not Applicable   Eustace MooreNelson, Kionna Brier Sue, MD 04/20/18 [phone]

## 2018-04-20 NOTE — Discharge Instructions (Signed)
Penicillin 4 times a day as directed.  Repeat if needed. Take ibuprofen 3 times a day for pain.  Take with food. Call your dentist tomorrow to schedule a follow-up.

## 2018-07-02 ENCOUNTER — Ambulatory Visit (INDEPENDENT_AMBULATORY_CARE_PROVIDER_SITE_OTHER): Payer: Medicaid Other | Admitting: Allergy

## 2018-07-02 ENCOUNTER — Encounter: Payer: Self-pay | Admitting: Allergy

## 2018-07-02 VITALS — BP 110/64 | HR 82 | Temp 98.3°F | Resp 16 | Ht 69.0 in | Wt 246.2 lb

## 2018-07-02 DIAGNOSIS — T781XXD Other adverse food reactions, not elsewhere classified, subsequent encounter: Secondary | ICD-10-CM

## 2018-07-02 DIAGNOSIS — J3089 Other allergic rhinitis: Secondary | ICD-10-CM | POA: Insufficient documentation

## 2018-07-02 DIAGNOSIS — J4520 Mild intermittent asthma, uncomplicated: Secondary | ICD-10-CM | POA: Insufficient documentation

## 2018-07-02 DIAGNOSIS — T781XXA Other adverse food reactions, not elsewhere classified, initial encounter: Secondary | ICD-10-CM | POA: Insufficient documentation

## 2018-07-02 MED ORDER — FLUTICASONE PROPIONATE 50 MCG/ACT NA SUSP: 1.0000 | Freq: Every day | NASAL | 5 refills | Status: DC

## 2018-07-02 NOTE — Progress Notes (Signed)
New Patient Note  RE: Claudia Price MRN: [id number] DOB: 14-Jun-1991 Date of Office Visit: 07/02/2018  Referring provider: Joette Catching, MD Primary care provider: Joette Catching, MD  Chief Complaint: food allergies; Allergies; and Allergy Testing  History of Present Illness: I had the pleasure of seeing Claudia Price for initial evaluation at the [facility] on 07/02/2018. She is a 27 y.o. female, who is referred here by Joette Catching, MD for the evaluation of food allergy and environmental allergy.  Foods: Patient was diagnosed with hypothyroidism about 2 years ago and since she avoided gluten, dairy and soy she is feeling much better and was able to lose 100 lbs. She has been doing this for over 1 year. Gluten and dairy causes abdominal bloating and sometimes has associated abdominal pains with less frequent bowel movements.  Soy seems to cause body aches and joint pains.  Usually symptoms can occur within the same day and lasts for up to 1 week. Denies any hives, swelling, wheezing, diarrhea, vomiting.  Past work up includes: none Dietary History: patient has been eating other foods including eggs, peanut, treenuts, sesame, shellfish, seafood, meats, fruits and vegetables.  She reports reading labels and avoiding wheat, dairy and soy in diet completely.   Rhinitis: She reports symptoms of wheezing occasionally with minimal rhino conjunctivitis complaints. Symptoms have been going on for 1 years. The symptoms are present during the fall. Anosmia: no. Headache: no.She has used Flonase, OTC antihistamines with some improvement in symptoms. Sinus infections: no. Previous work up includes: none.  Assessment and Plan: Claudia Price is a 27 y.o. female with: Other allergic rhinitis Minimal rhino conjunctivitis symptoms for the past 1 year mainly during the fall. Used Flonase and OTC antihistamines with some benefit.  Today's skin testing showed: Positive to  dust mites, mold and cat.  Discussed environmental control measures.  May use over the counter antihistamines such as Zyrtec (cetirizine), Claritin (loratadine), Allegra (fexofenadine), or Xyzal (levocetirizine) daily as needed.  May use fluticasone nasal spray 1-2 sprays daily as needed.  Adverse food reaction Diagnosed with hypothyroidism over 1 year ago and noticed improvement in health with avoiding gluten, dairy and soy. Gluten and dairy caused GI issues and soy caused body aches/joint pains.  Discussed with patient that her symptoms seem to be non-IgE mediated which skin testing does not have a good specificity or sensitivity for.  Today's skin testing showed: Negative to soy, wheat and dairy.  Recommend to continue to avoid soy, wheat and dairy.  Mild intermittent asthma without complication Reports wheezing at times with no specific triggers noted.  Used to be on Symbicort in the past.  Today's spirometry was normal.  Monitor symptoms.  May use albuterol rescue inhaler 2 puffs or nebulizer every 4 to 6 hours as needed for shortness of breath, chest tightness, coughing, and wheezing. May use albuterol rescue inhaler 2 puffs 5 to 15 minutes prior to strenuous physical activities.  Return in about 6 months (around 01/01/2019).  Meds ordered this encounter  Medications  . fluticasone (FLONASE) 50 MCG/ACT nasal spray    Sig: Place 1-2 sprays into both nostrils daily.    Dispense:  16 g    Refill:  5   Other allergy screening: Asthma: yes  She reports symptoms of wheezing for unknown years. Current medications include none. She tried the following inhalers: symbicort. Main asthma triggers are unknown. In the last month, frequency of asthma symptoms: 0x/week. Frequency of nocturnal symptoms: 0x/month. Frequency of  SABA use: 0x/week. Interference with physical activity: 0. Sleep is undisturbed. In the last 12 months, emergency room visits/urgent care visits/doctor office visits or  hospitalizations due to asthma: 0. In the last 12 months, oral steroids courses: 0 Lifetime history of hospitalization for asthma: no. Prior intubations: no. History of pneumonia: no. She was not evaluated by allergist/pulmonologist in the past. Smoking exposure: second hand smoke as a child. Up to date with flu vaccine: yes.  Rhino conjunctivitis: yes Food allergy: yes Medication allergy: no Hymenoptera allergy: no Urticaria: no Eczema:no History of recurrent infections suggestive of immunodeficency: no  Diagnostics: Spirometry:  Tracings reviewed. Her effort: Good reproducible efforts. FVC: 4.24 L FEV1: 3.78 L, 102 % predicted FEV1/FVC ratio: 89 % Interpretation: Spirometry consistent with normal pattern.  Please see scanned spirometry results for details.  Skin Testing: Environmental allergy panel and select foods Positive test to: dust mites, mold and cat. Negative test to: soy, dairy, casein and wheat.  Results discussed with patient/family. Airborne Adult Perc - 07/02/18 1108    Time Antigen Placed  1045    Allergen Manufacturer  Greer    Location  Back    Number of Test  59    Panel 1  Select    1. Control-Buffer 50% Glycerol  Negative    2. Control-Histamine 1 mg/ml  3+    3. Albumin saline  Negative    4. Bahia  Negative    5. French Southern Territories  Negative    6. Johnson  Negative    7. Kentucky Blue  Negative    8. Meadow Fescue  Negative    9. Perennial Rye  Negative    10. Sweet Vernal  Negative    11. Timothy  Negative    12. Cocklebur  Negative    13. Burweed Marshelder  Negative    14. Ragweed, short  Negative    15. Ragweed, Giant  Negative    16. Plantain,  English  Negative    17. Lamb's Quarters  Negative    18. Sheep Sorrell  Negative    19. Rough Pigweed  Negative    20. Marsh Elder, Rough  Negative    21. Mugwort, Common  Negative    22. Ash mix  Negative    23. Birch mix  Negative    24. Beech American  Negative    25. Box, Elder  Negative    26. Cedar,  red  Negative    27. Cottonwood, Guinea-Bissau  Negative    28. Elm mix  Negative    29. Hickory mix  Negative    30. Maple mix  Negative    31. Oak, Guinea-Bissau mix  Negative    32. Pecan Pollen  Negative    33. Pine mix  Negative    34. Sycamore Eastern  Negative    35. Walnut, Black Pollen  Negative    36. Alternaria alternata  Negative    37. Cladosporium Herbarum  Negative    38. Aspergillus mix  Negative    39. Penicillium mix  Negative    40. Bipolaris sorokiniana (Helminthosporium)  Negative    41. Drechslera spicifera (Curvularia)  Negative    42. Mucor plumbeus  Negative    43. Fusarium moniliforme  Negative    44. Aureobasidium pullulans (pullulara)  Negative    45. Rhizopus oryzae  Negative    46. Botrytis cinera  Negative    47. Epicoccum nigrum  Negative    48. Phoma betae  Negative  49. Candida Albicans  Negative    50. Trichophyton mentagrophytes  Negative    51. Mite, D Farinae  5,000 AU/ml  2+    52. Mite, D Pteronyssinus  5,000 AU/ml  2+    53. Cat Hair 10,000 BAU/ml  Negative    54.  Dog Epithelia  Negative    55. Mixed Feathers  Negative    56. Horse Epithelia  Negative    57. Cockroach, German  Negative    58. Mouse  Negative    59. Tobacco Leaf  Negative     Food Perc - 07/02/18 1045    Time Antigen Placed  1046    Allergen Manufacturer  Waynette Buttery    Location  Back    1. Peanut  Omitted    2. Soybean food  Negative    3. Wheat, whole  Negative    4. Sesame  Omitted    5. Milk, cow  Negative    6. Egg White, chicken  Omitted    7. Casein  Negative    8. Shellfish mix  Omitted    9. Fish mix  Omitted    10. Cashew  Omitted     Intradermal - 07/02/18 1115    Time Antigen Placed  1115    Allergen Manufacturer  Waynette Buttery    Location  Arm    Number of Test  14    Intradermal  Select    Control  Negative    French Southern Territories  Negative    Johnson  Negative    7 Grass  Negative    Ragweed mix  Negative    Weed mix  Negative    Tree mix  Negative    Mold 1  Negative     Mold 2  2+    Mold 3  Negative    Mold 4  3+    Cat  2+    Dog  Negative    Cockroach  Negative       Past Medical History: Patient Active Problem List   Diagnosis Date Noted  . Other allergic rhinitis 07/02/2018  . Adverse food reaction 07/02/2018  . Mild intermittent asthma without complication 07/02/2018  . Vaginal birth after cesarean (VBAC) 01/11/2015  . [redacted] weeks gestation of pregnancy   . Maternal morbid obesity, antepartum (HCC)   . [redacted] weeks gestation of pregnancy   . Cervical insufficiency during pregnancy in second trimester, antepartum   . Maternal morbid obesity in second trimester, antepartum (HCC)   . Evaluate anatomy not seen on prior sonogram   . Obesity affecting pregnancy in second trimester, antepartum   . [redacted] weeks gestation of pregnancy   . [redacted] weeks gestation of pregnancy   . Cervical insufficiency during pregnancy, antepartum   . History of pregnancy loss in prior pregnancy, currently pregnant   . Pregnant state, incidental   . Cervical incompetence, antepartum condition or complication   . Examination to determine fetal viability of pregnancy   . Cervical cerclage suture present   . Right leg swelling 02/14/2014  . Leg pain, right 02/14/2014   Past Medical History:  Diagnosis Date  . Anemia    borderline  . Anxiety   . Thyroid disease    Past Surgical History: Past Surgical History:  Procedure Laterality Date  . ANKLE FRACTURE SURGERY    . CERVICAL CERCLAGE    . CERVICAL CERCLAGE N/A 07/08/2014   Procedure: MODIFIED MCDONALD CERCLAGE CERVICAL;  Surgeon: Loney Laurence, MD;  Location:  [facility];  Service: Gynecology;  Laterality: N/A;  . CESAREAN SECTION     Medication List:  Current Outpatient Medications  Medication Sig Dispense Refill  . ibuprofen (ADVIL,MOTRIN) 800 MG tablet Take 1 tablet (800 mg total) by mouth 3 (three) times daily. 21 tablet 0  . levothyroxine (SYNTHROID, LEVOTHROID) 150 MCG tablet Take 150 mcg by mouth daily before  breakfast.    . PARAGARD INTRAUTERINE COPPER IUD IUD 1 each by Intrauterine route once.    . fluticasone (FLONASE) 50 MCG/ACT nasal spray Place 1-2 sprays into both nostrils daily. 16 g 5  . mirabegron ER (MYRBETRIQ) 25 MG TB24 tablet Take by mouth.     No current facility-administered medications for this visit.    Allergies: Allergies  Allergen Reactions  . Dairy Aid [Lactase]   . Gluten Meal   . Soy Allergy    Social History: Social History   Socioeconomic History  . Marital status: Married    Spouse name: Not on file  . Number of children: Not on file  . Years of education: Not on file  . Highest education level: Not on file  Occupational History  . Not on file  Social Needs  . Financial resource strain: Not on file  . Food insecurity:    Worry: Not on file    Inability: Not on file  . Transportation needs:    Medical: Not on file    Non-medical: Not on file  Tobacco Use  . Smoking status: Former Smoker    Last attempt to quit: 07/13/2010    Years since quitting: 7.9  . Smokeless tobacco: Never Used  Substance and Sexual Activity  . Alcohol use: No  . Drug use: No  . Sexual activity: Yes  Lifestyle  . Physical activity:    Days per week: Not on file    Minutes per session: Not on file  . Stress: Not on file  Relationships  . Social connections:    Talks on phone: Not on file    Gets together: Not on file    Attends religious service: Not on file    Active member of club or organization: Not on file    Attends meetings of clubs or organizations: Not on file    Relationship status: Not on file  Other Topics Concern  . Not on file  Social History Narrative  . Not on file   Lives in an apartment. Smoking: Denies Occupation: Press photographer HistorySurveyor, minerals in the house: yes Carpet in the family room: yes Carpet in the bedroom: yes Heating: gas Cooling: central Pet: yes Dogs , she has 5 dogs  Family History: History reviewed.  No pertinent family history. Problem                               Relation Asthma                                   No  Eczema                                No  Food allergy                          No  Allergic rhino conjunctivitis  No   Review of Systems  Constitutional: Negative for appetite change, chills, fever and unexpected weight change.  HENT: Negative for congestion and rhinorrhea.   Eyes: Negative for itching.  Respiratory: Negative for cough, chest tightness, shortness of breath and wheezing.   Cardiovascular: Negative for chest pain.  Gastrointestinal: Negative for abdominal pain.  Genitourinary: Negative for difficulty urinating.  Skin: Negative for rash.  Allergic/Immunologic: Positive for environmental allergies and food allergies.  Neurological: Negative for headaches.   Objective: BP 110/64   Pulse 82   Temp 98.3 F (36.8 C)   Resp 16   Ht 5\' 9"  (1.753 m)   Wt 246 lb 3.2 oz (111.7 kg)   BMI 36.36 kg/m  Body mass index is 36.36 kg/m. Physical Exam  Constitutional: She is oriented to person, place, and time. She appears well-developed and well-nourished.  HENT:  Head: Normocephalic and atraumatic.  Right Ear: External ear normal.  Left Ear: External ear normal.  Nose: Nose normal.  Mouth/Throat: Oropharynx is clear and moist.  Eyes: Conjunctivae and EOM are normal.  Neck: Neck supple.  Cardiovascular: Normal rate, regular rhythm and normal heart sounds. Exam reveals no gallop and no friction rub.  No murmur heard. Pulmonary/Chest: Effort normal and breath sounds normal. She has no wheezes. She has no rales.  Abdominal: Soft. Bowel sounds are normal. There is no tenderness.  Lymphadenopathy:    She has no cervical adenopathy.  Neurological: She is alert and oriented to person, place, and time.  Skin: Skin is warm. No rash noted.  Psychiatric: She has a normal mood and affect. Her behavior is normal.  Nursing note and vitals reviewed.  The plan  was reviewed with the patient/family, and all questions/concerned were addressed.  It was my pleasure to see Claudia Price today and participate in her care. Please feel free to contact me with any questions or concerns.  Sincerely,  Wyline Mood, DO Allergy & Immunology  [facility] office: [phone] [facility] [phone]

## 2018-07-02 NOTE — Assessment & Plan Note (Addendum)
Diagnosed with hypothyroidism over 1 year ago and noticed improvement in health with avoiding gluten, dairy and soy. Gluten and dairy caused GI issues and soy caused body aches/joint pains.  Discussed with patient that her symptoms seem to be non-IgE mediated which skin testing does not have a good specificity or sensitivity for.  Today's skin testing showed: Negative to soy, wheat and dairy.  Recommend to continue to avoid soy, wheat and dairy.

## 2018-07-02 NOTE — Assessment & Plan Note (Addendum)
Reports wheezing at times with no specific triggers noted.  Used to be on Symbicort in the past.  Today's spirometry was normal.  Monitor symptoms.  May use albuterol rescue inhaler 2 puffs or nebulizer every 4 to 6 hours as needed for shortness of breath, chest tightness, coughing, and wheezing. May use albuterol rescue inhaler 2 puffs 5 to 15 minutes prior to strenuous physical activities.

## 2018-07-02 NOTE — Patient Instructions (Addendum)
Other allergic rhinitis Minimal rhino conjunctivitis symptoms for the past 1 year mainly during the fall. Used Flonase and OTC antihistamines with some benefit.  Today's skin testing showed: Positive to dust mites, mold and cat.  Discussed environmental control measures.  May use over the counter antihistamines such as Zyrtec (cetirizine), Claritin (loratadine), Allegra (fexofenadine), or Xyzal (levocetirizine) daily as needed.  May use fluticasone nasal spray 1-2 sprays daily as needed.  Adverse food reaction Diagnosed with hypothyroidism over 1 year ago and noticed improvement in health with avoiding gluten, dairy and soy. Gluten and dairy caused GI issues and soy caused body aches/joint pains.  Discussed with patient that her symptoms seem to be non-IgE mediated which skin testing does not have a good specificity or sensitivity for.  Today's skin testing showed: Negative to soy, wheat and dairy.  Recommend to continue to avoid soy, wheat and dairy.  Mild intermittent asthma without complication Reports wheezing at times with no specific triggers noted.  Used to be on Symbicort in the past.  Today's spirometry was normal.  Monitor symptoms.  May use albuterol rescue inhaler 2 puffs or nebulizer every 4 to 6 hours as needed for shortness of breath, chest tightness, coughing, and wheezing. May use albuterol rescue inhaler 2 puffs 5 to 15 minutes prior to strenuous physical activities.  Return in about 6 months (around 01/01/2019).

## 2018-07-02 NOTE — Assessment & Plan Note (Addendum)
Minimal rhino conjunctivitis symptoms for the past 1 year mainly during the fall. Used Flonase and OTC antihistamines with some benefit.  Today's skin testing showed: Positive to dust mites, mold and cat.  Discussed environmental control measures.  May use over the counter antihistamines such as Zyrtec (cetirizine), Claritin (loratadine), Allegra (fexofenadine), or Xyzal (levocetirizine) daily as needed.  May use fluticasone nasal spray 1-2 sprays daily as needed.

## 2018-07-21 ENCOUNTER — Other Ambulatory Visit: Payer: Self-pay | Admitting: *Deleted

## 2018-07-21 MED ORDER — FLUTICASONE PROPIONATE 50 MCG/ACT NA SUSP: 1.0000 | Freq: Every day | NASAL | 5 refills | Status: DC

## 2018-07-21 MED ORDER — ALBUTEROL SULFATE HFA 108 (90 BASE) MCG/ACT IN AERS: 2.0000 | INHALATION_SPRAY | RESPIRATORY_TRACT | 3 refills | Status: DC | PRN

## 2018-07-21 NOTE — Telephone Encounter (Signed)
Spoke to sister Michel Harrowshleigh Fernandez patient's medication was sent to wrong pharmacy also albuterol hfa was not sent in. Rx sent to [facility]

## 2018-07-21 NOTE — Telephone Encounter (Signed)
Called patient advised medications were sent in to [facility].

## 2018-12-10 ENCOUNTER — Other Ambulatory Visit: Payer: Self-pay

## 2018-12-10 MED ORDER — ALBUTEROL SULFATE HFA 108 (90 BASE) MCG/ACT IN AERS: 2.0000 | INHALATION_SPRAY | RESPIRATORY_TRACT | 1 refills | Status: DC | PRN

## 2019-01-14 ENCOUNTER — Encounter: Payer: Self-pay | Admitting: Allergy

## 2019-01-14 ENCOUNTER — Ambulatory Visit (INDEPENDENT_AMBULATORY_CARE_PROVIDER_SITE_OTHER): Payer: Medicaid Other | Admitting: Allergy

## 2019-01-14 ENCOUNTER — Other Ambulatory Visit: Payer: Self-pay

## 2019-01-14 DIAGNOSIS — J4520 Mild intermittent asthma, uncomplicated: Secondary | ICD-10-CM

## 2019-01-14 DIAGNOSIS — J3089 Other allergic rhinitis: Secondary | ICD-10-CM | POA: Diagnosis not present

## 2019-01-14 DIAGNOSIS — T781XXD Other adverse food reactions, not elsewhere classified, subsequent encounter: Secondary | ICD-10-CM | POA: Diagnosis not present

## 2019-01-14 MED ORDER — LEVOCETIRIZINE DIHYDROCHLORIDE 5 MG PO TABS: 5.0000 mg | ORAL_TABLET | Freq: Every evening | ORAL | 5 refills | Status: DC

## 2019-01-14 NOTE — Progress Notes (Signed)
RE: Claudia Price MRN: [id number] DOB: 1990-09-06 Date of Telemedicine Visit: 01/14/2019  Referring provider: Joette CatchingNyland, Leonard, MD Primary care provider: Joette CatchingNyland, Leonard, MD  Chief Complaint: Asthma (she has been doing very good. she did have some issues back in the fall and very early this year. things calmed down after February. ) and Allergic Rhinitis  (she has not been taking her claritin on a daily basis due to being in the house. she definitely notices a difference when she goes outside. she does have some allergy related headaches that seem to improved. )   Telemedicine Follow Up Visit via Telephone: I connected with Claudia Price for a follow up on 01/14/19 by telephone and verified that I am speaking with the correct person using two identifiers.   I discussed the limitations, risks, security and privacy concerns of performing an evaluation and management service by telephone and the availability of in person appointments. I also discussed with the patient that there may be a patient responsible charge related to this service. The patient expressed understanding and agreed to proceed.  Patient is at home. Provider is at the office.  Visit start time: 3:06PM Visit end time: 3:26PM [facility] and medical assistant/nurse: Mliss FritzKayla Black.  History of Present Illness: She is a 28 y.o. female, who is being followed for allergic rhinitis, adverse food reaction, asthma. Her previous allergy office visit was on 07/02/2018 with Dr. Selena BattenKim. Today is a regular follow up visit.  Other allergic rhinitis Had some issues in the beginning of the year in January through March with headaches. Symptoms resolved as long as she took Claritin D. Tried antihistamines without decongestant with no benefit. Seemed to be worse when not at home. Since the COVID-19 pandemic and stay at home order she has been doing much better with no medication. Only using  Flonase as needed. No issues with blood pressure. Denies any current rhinitis symptoms.   Adverse food reaction Currently avoiding soy, wheat and dairy. No GI issues.   Mild intermittent asthma without complication Used rescue inhaler during URI in February for her breathing issues. She had fevers with this and flu like symptoms.  Otherwise denies any SOB, coughing, wheezing, chest tightness, nocturnal awakenings, ER/urgent care visits or prednisone use since the last visit.  Assessment and Plan: Claudia Price is a 28 y.o. female with: Mild intermittent asthma without complication Past history - Reports wheezing at times with no specific triggers noted.  Used to be on Symbicort in the past. Interim history - Well controlled. Only used albuterol during URI in the winter.   May use albuterol rescue inhaler 2 puffs or nebulizer every 4 to 6 hours as needed for shortness of breath, chest tightness, coughing, and wheezing. May use albuterol rescue inhaler 2 puffs 5 to 15 minutes prior to strenuous physical activities. Monitor frequency of use.   Get spirometry at next visit.   Other allergic rhinitis Past history - Minimal rhino conjunctivitis symptoms for the past 1 year mainly during the fall. Used Flonase and OTC antihistamines with some benefit. 2019 skin testing showed: Positive to dust mites, mold and cat. Interim history - symptoms flare when not at home and during the fall.   Continue environmental control measures.  Try Xyzal (levocetirizine) daily as needed.  Minimize using decongestants.   May use fluticasone nasal spray 1-2 sprays daily as needed.  If symptoms not controlled then let us know.   Adverse food reaction Past history - Diagnosed  with hypothyroidism over 1 year ago and noticed improvement in health with avoiding gluten, dairy and soy. Gluten and dairy caused GI issues and soy caused body aches/joint pains. 2019 skin testing showed: Negative to soy, wheat and dairy.  Interim history - avoiding soy, wheat and dairy and doing well.   Continue to avoid soy, wheat and dairy.  Return in about 6 months (around 07/17/2019).  Meds ordered this encounter  Medications  . levocetirizine (XYZAL) 5 MG tablet    Sig: Take 1 tablet (5 mg total) by mouth every evening.    Dispense:  30 tablet    Refill:  5   Diagnostics: None.  Medication List:  Current Outpatient Medications  Medication Sig Dispense Refill  . albuterol (PROAIR HFA) 108 (90 Base) MCG/ACT inhaler Inhale 2 puffs into the lungs every 4 (four) hours as needed. 1 Inhaler 1  . fluticasone (FLONASE) 50 MCG/ACT nasal spray Place 1-2 sprays into both nostrils daily. 16 g 5  . ibuprofen (ADVIL,MOTRIN) 800 MG tablet Take 1 tablet (800 mg total) by mouth 3 (three) times daily. 21 tablet 0  . levothyroxine (SYNTHROID, LEVOTHROID) 150 MCG tablet Take 150 mcg by mouth daily before breakfast.    . loratadine-pseudoephedrine (CLARITIN-D 24-HOUR) 10-240 MG 24 hr tablet Take 1 tablet by mouth daily.    Marland Kitchen PARAGARD INTRAUTERINE COPPER IUD IUD 1 each by Intrauterine route once.    Marland Kitchen levocetirizine (XYZAL) 5 MG tablet Take 1 tablet (5 mg total) by mouth every evening. 30 tablet 5   No current facility-administered medications for this visit.    Allergies: Allergies  Allergen Reactions  . Dairy Aid [Lactase]   . Gluten Meal   . Soy Allergy    I reviewed her past medical history, social history, family history, and environmental history and no significant changes have been reported from previous visit on 07/02/2018.  Review of Systems  Constitutional: Negative for appetite change, chills, fever and unexpected weight change.  HENT: Negative for congestion and rhinorrhea.   Eyes: Negative for itching.  Respiratory: Negative for cough, chest tightness, shortness of breath and wheezing.   Cardiovascular: Negative for chest pain.  Gastrointestinal: Negative for abdominal pain.  Genitourinary: Negative for  difficulty urinating.  Skin: Negative for rash.  Allergic/Immunologic: Positive for environmental allergies and food allergies.  Neurological: Positive for headaches.   Objective: Physical Exam Not obtained as encounter was done via telephone.   Previous notes and tests were reviewed.  I discussed the assessment and treatment plan with the patient. The patient was provided an opportunity to ask questions and all were answered. The patient agreed with the plan and demonstrated an understanding of the instructions. After visit summary/patient instructions available via mychart.   The patient was advised to call back or seek an in-person evaluation if the symptoms worsen or if the condition fails to improve as anticipated.  I provided 20 minutes of non-face-to-face time during this encounter.  It was my pleasure to participate in [place] care today. Please feel free to contact me with any questions or concerns.   Sincerely,  Wyline Mood, DO Allergy & Immunology  [facility] office: [phone] [facility] office: [phone]

## 2019-01-14 NOTE — Assessment & Plan Note (Signed)
Past history - Diagnosed with hypothyroidism over 1 year ago and noticed improvement in health with avoiding gluten, dairy and soy. Gluten and dairy caused GI issues and soy caused body aches/joint pains. 2019 skin testing showed: Negative to soy, wheat and dairy. Interim history - avoiding soy, wheat and dairy and doing well.   Continue to avoid soy, wheat and dairy.

## 2019-01-14 NOTE — Assessment & Plan Note (Signed)
Past history - Reports wheezing at times with no specific triggers noted.  Used to be on Symbicort in the past. Interim history - Well controlled. Only used albuterol during URI in the winter.   May use albuterol rescue inhaler 2 puffs or nebulizer every 4 to 6 hours as needed for shortness of breath, chest tightness, coughing, and wheezing. May use albuterol rescue inhaler 2 puffs 5 to 15 minutes prior to strenuous physical activities. Monitor frequency of use.   Get spirometry at next visit.

## 2019-01-14 NOTE — Patient Instructions (Addendum)
Other allergic rhinitis 2019 skin testing showed: Positive to dust mites, mold and cat.  Continue environmental control measures.  Try Xyzal (levocetirizine) daily as needed.  Minimize using decongestants.   May use fluticasone nasal spray 1-2 sprays daily as needed.  If symptoms not controlled then let us know.   Adverse food reaction  Continue to avoid soy, wheat and dairy.  Mild intermittent asthma without complication  May use albuterol rescue inhaler 2 puffs or nebulizer every 4 to 6 hours as needed for shortness of breath, chest tightness, coughing, and wheezing. May use albuterol rescue inhaler 2 puffs 5 to 15 minutes prior to strenuous physical activities. Monitor frequency of use.   Follow up in 6 months

## 2019-01-14 NOTE — Assessment & Plan Note (Signed)
Past history - Minimal rhino conjunctivitis symptoms for the past 1 year mainly during the fall. Used Flonase and OTC antihistamines with some benefit. 2019 skin testing showed: Positive to dust mites, mold and cat. Interim history - symptoms flare when not at home and during the fall.   Continue environmental control measures.  Try Xyzal (levocetirizine) daily as needed.  Minimize using decongestants.   May use fluticasone nasal spray 1-2 sprays daily as needed.  If symptoms not controlled then let us know.

## 2019-03-02 NOTE — Progress Notes (Signed)
Formatting of this note is different from the original.  Images from the original note were not included.  Subjective     Patient ID:  Janet Alexander is a 28 y.o. (DOB 06-06-1991) female.     Chief Complaint   Patient presents with   ? Dental Pain       HPI    Problem List, Past Medical History, Past Surgical History, Past Family History, Social History, Medications, and Allergies, were reviewed and updated as appropriate. Mrs. Janet Alexander comes in today with complaints of dental pain.  She says it is been going on since Friday.  She does admit to having trouble with this tooth off and on for the last several months.  She says she lost a crown off of it and has had a few infections since then.  She has been unable to get to the dentist due to school and work and now COVID.  She states on Friday she began having some swelling in her face and gums.  She does not have much pain around the tooth at this point.  She has been using medicated mouthwash at home.  She has not called her dentist.    Review of Systems  Review of Systems   Constitutional: Negative for activity change, appetite change and fever.   HENT: Positive for dental problem and facial swelling. Negative for sore throat and trouble swallowing.    Eyes: Negative for visual disturbance.   Respiratory: Negative for cough, chest tightness and shortness of breath.    Cardiovascular: Negative for chest pain.   Gastrointestinal: Negative for abdominal pain.   Musculoskeletal: Negative for arthralgias and myalgias.   Skin: Negative for rash.   Neurological: Negative for dizziness and headaches.     Objective     BP 110/70   Pulse 74   Temp 98 F (36.7 C)   Resp 20   Ht 5\' 11"  (1.803 m)   Wt 250 lb (113.4 kg)   SpO2 98%   BMI 34.87 kg/m     Physical Exam  Constitutional:       Appearance: Normal appearance. She is well-developed.   HENT:      Head: Normocephalic and atraumatic.      Right Ear: External ear normal.      Left Ear: External ear normal.       Nose: Nose normal.      Mouth/Throat:      Mouth: Mucous membranes are moist.      Dentition: Abnormal dentition. Dental caries and dental abscesses present.      Pharynx: Oropharynx is clear.   Cardiovascular:      Rate and Rhythm: Normal rate and regular rhythm.   Pulmonary:      Effort: Pulmonary effort is normal.      Breath sounds: Normal breath sounds.   Abdominal:      Palpations: Abdomen is soft.   Skin:     General: Skin is warm and dry.   Neurological:      Mental Status: She is alert and oriented to person, place, and time.     ---------------------------  Labs last 12 hours  Results for orders placed or performed in visit on 02/23/19 (from the past 336 hour(s))   Varicella zoster antibody, IgM    Collection Time: 02/23/19  1:14 PM   Result Value Ref Range    Varicella IgM <0.91 0.00 - 0.90 index   Measles/Mumps/Rubella Immunity    Collection Time: 02/23/19  1:14 PM  Result Value Ref Range    Rubella Ab, IgG 2.39 Immune >0.99 index    Measles IgG >300.0 Immune >16.4 AU/mL    Mumps IgG <9.0 (L) Immune >10.9 AU/mL       Impression     1. Pain, dental         Plan     1.  Dental pain -prescription sent for amoxicillin 500 mg to be taken 3 times a day for the next 10 days.  She may continue to use medicated mouthwash.  Recommended calling her dentist and getting an appointment scheduled to see them.  Follow-up as needed    See encounter after visit summary for patient specific instructions.    Follow up if symptoms worsen or fail to improve.    No orders of the defined types were placed in this encounter.      Patient's Medications        Accurate as of March 02, 2019  2:06 PM. Reflects encounter med changes as of last refresh         New Prescriptions      Instructions   amoxicillin 500 mg capsule  Commonly known as:  AMOXIL  Started by:  Domenick Bookbinder, PA-C   500 mg, Oral, 3 times a day      Continued Medications      Instructions   albuterol sulfate HFA 108 (90 Base) MCG/ACT inhaler  Commonly known as:   PROVENTIL,VENTOLIN,PROAIR   Inhalation    levocetirizine 5 MG tablet  Commonly known as:  XYZAL   5 mg, Oral, Daily    loratadine-pseudoephedrine 10-240 MG per 24 hr tablet  Commonly known as:  LORATADINE-D,LORATA-DINE,CLARITIN-D 24HR   1 tablet, Oral, Daily    meloxicam 7.5 mg tablet  Commonly known as:  MOBIC   7.5 mg, Oral, After breakfast & at bedtime      Modified Medications      Instructions   budesonide-formoterol 160-4.5 mcg/actuation inhaler  Commonly known as:  SYMBICORT  What changed:  Another medication with the same name was removed. Continue taking this medication, and follow the directions you see here.  Changed by:  Domenick Bookbinder, PA-C   2 puffs, Inhalation, 2 times a day    levothyroxine sodium 150 mcg tablet  Commonly known as:  SYNTHROID,LEVOTHROID,LEVOXYL  What changed:  Another medication with the same name was removed. Continue taking this medication, and follow the directions you see here.  Changed by:  Domenick Bookbinder, PA-C   150 mcg, Oral, Daily      Discontinued Medications    mirabegron 25 mg Tb24 24 hr tablet  Commonly known as:  MYRBETRIQ  Stopped by:  Domenick Bookbinder, PA-C    oxybutynin 10 MG 24 hr tablet  Commonly known as:  DITROPAN XL  Stopped by:  Domenick Bookbinder, PA-C        Risks, benefits, and alternatives of the medications and treatment plan prescribed today were discussed, and patient expressed understanding. Plan follow-up as discussed or as needed if any worsening symptoms or change in condition.      A yearly health maintenance exam was recommended where appropriate.        Electronically signed by Zackery Barefoot, PA-C at 03/02/2019  2:09 PM EDT

## 2019-07-20 ENCOUNTER — Ambulatory Visit: Payer: Medicaid Other | Admitting: Allergy

## 2019-07-20 NOTE — Progress Notes (Deleted)
Follow Up Note  RE: ALIANNA Price MRN: [id number] DOB: October 29, 1990 Date of Office Visit: 07/20/2019  Referring provider: Joette Catching, MD Primary care provider: Joette Catching, MD  Chief Complaint: No chief complaint on file.  History of Present Illness: I had the pleasure of seeing Claudia Price for a follow up visit at the [facility] on 07/20/2019. She is a 28 y.o. female, who is being followed for asthma, allergic rhino conjunctivitis, adverse food reaction . Today she is here for regular follow up visit. Her previous allergy office visit was on 01/14/2019 with Dr. Selena Batten via telemedicine.   Mild intermittent asthma without complication Past history - Reports wheezing at times with no specific triggers noted.  Used to be on Symbicort in the past. Interim history - Well controlled. Only used albuterol during URI in the winter.   May use albuterol rescue inhaler 2 puffs or nebulizer every 4 to 6 hours as needed for shortness of breath, chest tightness, coughing, and wheezing. May use albuterol rescue inhaler 2 puffs 5 to 15 minutes prior to strenuous physical activities. Monitor frequency of use.   Get spirometry at next visit.   Other allergic rhinitis Past history - Minimal rhino conjunctivitis symptoms for the past 1 year mainly during the fall. Used Flonase and OTC antihistamines with some benefit. 2019 skin testing showed: Positive to dust mites, mold and cat. Interim history - symptoms flare when not at home and during the fall.   Continue environmental control measures.  Try Xyzal (levocetirizine) daily as needed.  Minimize using decongestants.   May use fluticasone nasal spray 1-2 sprays daily as needed.  If symptoms not controlled then let Claudia Price know.   Adverse food reaction Past history - Diagnosed with hypothyroidism over 1 year ago and noticed improvement in health with avoiding gluten, dairy and soy. Gluten and dairy caused GI issues  and soy caused body aches/joint pains. 2019 skin testing showed: Negative to soy, wheat and dairy. Interim history - avoiding soy, wheat and dairy and doing well.   Continue to avoid soy,wheatand dairy.  Return in about 6 months (around 07/17/2019).  Assessment and Plan: Claudia Price is a 28 y.o. female with: No problem-specific Assessment & Plan notes found for this encounter.  No follow-ups on file.  No orders of the defined types were placed in this encounter.  Lab Orders  No laboratory test(s) ordered today    Diagnostics: Spirometry:  Tracings reviewed. Her effort: {Blank single:19197::"Good reproducible efforts.","It was hard to get consistent efforts and there is a question as to whether this reflects a maximal maneuver.","Poor effort, data can not be interpreted."} FVC: ***L FEV1: ***L, ***% predicted FEV1/FVC ratio: ***% Interpretation: {Blank single:19197::"Spirometry consistent with mild obstructive disease","Spirometry consistent with moderate obstructive disease","Spirometry consistent with severe obstructive disease","Spirometry consistent with possible restrictive disease","Spirometry consistent with mixed obstructive and restrictive disease","Spirometry uninterpretable due to technique","Spirometry consistent with normal pattern","No overt abnormalities noted given today's efforts"}.  Please see scanned spirometry results for details.  Skin Testing: {Blank single:19197::"Select foods","Environmental allergy panel","Environmental allergy panel and select foods","Food allergy panel","None","Deferred due to recent antihistamines use"}. Positive test to: ***. Negative test to: ***.  Results discussed with patient/family.   Medication List:  Current Outpatient Medications  Medication Sig Dispense Refill  . albuterol (PROAIR HFA) 108 (90 Base) MCG/ACT inhaler Inhale 2 puffs into the lungs every 4 (four) hours as needed. 1 Inhaler 1  . fluticasone (FLONASE) 50 MCG/ACT  nasal spray Place 1-2 sprays into both  nostrils daily. 16 g 5  . ibuprofen (ADVIL,MOTRIN) 800 MG tablet Take 1 tablet (800 mg total) by mouth 3 (three) times daily. 21 tablet 0  . levocetirizine (XYZAL) 5 MG tablet Take 1 tablet (5 mg total) by mouth every evening. 30 tablet 5  . levothyroxine (SYNTHROID, LEVOTHROID) 150 MCG tablet Take 150 mcg by mouth daily before breakfast.    . loratadine-pseudoephedrine (CLARITIN-D 24-HOUR) 10-240 MG 24 hr tablet Take 1 tablet by mouth daily.    Marland Kitchen PARAGARD INTRAUTERINE COPPER IUD IUD 1 each by Intrauterine route once.     No current facility-administered medications for this visit.    Allergies: Allergies  Allergen Reactions  . Dairy Aid [Lactase]   . Gluten Meal   . Soy Allergy    I reviewed her past medical history, social history, family history, and environmental history and no significant changes have been reported from her previous visit.  Review of Systems  Constitutional: Negative for appetite change, chills, fever and unexpected weight change.  HENT: Negative for congestion and rhinorrhea.   Eyes: Negative for itching.  Respiratory: Negative for cough, chest tightness, shortness of breath and wheezing.   Cardiovascular: Negative for chest pain.  Gastrointestinal: Negative for abdominal pain.  Genitourinary: Negative for difficulty urinating.  Skin: Negative for rash.  Allergic/Immunologic: Positive for environmental allergies and food allergies.  Neurological: Positive for headaches.   Objective: There were no vitals taken for this visit. There is no height or weight on file to calculate BMI. Physical Exam  Constitutional: She is oriented to person, place, and time. She appears well-developed and well-nourished.  HENT:  Head: Normocephalic and atraumatic.  Right Ear: External ear normal.  Left Ear: External ear normal.  Nose: Nose normal.  Mouth/Throat: Oropharynx is clear and moist.  Eyes: Conjunctivae and EOM are normal.   Neck: Neck supple.  Cardiovascular: Normal rate, regular rhythm and normal heart sounds. Exam reveals no gallop and no friction rub.  No murmur heard. Pulmonary/Chest: Effort normal and breath sounds normal. She has no wheezes. She has no rales.  Neurological: She is alert and oriented to person, place, and time.  Skin: Skin is warm. No rash noted.  Psychiatric: She has a normal mood and affect. Her behavior is normal.  Nursing note and vitals reviewed.  Previous notes and tests were reviewed. The plan was reviewed with the patient/family, and all questions/concerned were addressed.  It was my pleasure to see Tahiri today and participate in her care. Please feel free to contact me with any questions or concerns.  Sincerely,  Rexene Alberts, DO Allergy & Immunology  [facility] office: [phone] [facility] office: [place] office: [phone]

## 2019-11-26 ENCOUNTER — Ambulatory Visit: Payer: Self-pay

## 2020-03-03 ENCOUNTER — Encounter: Payer: Self-pay | Admitting: Allergy & Immunology

## 2020-03-03 ENCOUNTER — Ambulatory Visit (INDEPENDENT_AMBULATORY_CARE_PROVIDER_SITE_OTHER): Payer: 59 | Admitting: Allergy & Immunology

## 2020-03-03 ENCOUNTER — Other Ambulatory Visit: Payer: Self-pay

## 2020-03-03 VITALS — BP 102/80 | HR 84 | Resp 18 | Ht 68.0 in | Wt 289.4 lb

## 2020-03-03 DIAGNOSIS — J3089 Other allergic rhinitis: Secondary | ICD-10-CM

## 2020-03-03 DIAGNOSIS — K9049 Malabsorption due to intolerance, not elsewhere classified: Secondary | ICD-10-CM | POA: Diagnosis not present

## 2020-03-03 DIAGNOSIS — J4521 Mild intermittent asthma with (acute) exacerbation: Secondary | ICD-10-CM | POA: Diagnosis not present

## 2020-03-03 MED ORDER — FLUTICASONE PROPIONATE 50 MCG/ACT NA SUSP: 1.0000 | Freq: Every day | NASAL | 5 refills | Status: DC

## 2020-03-03 MED ORDER — LEVOCETIRIZINE DIHYDROCHLORIDE 5 MG PO TABS: 5.0000 mg | ORAL_TABLET | Freq: Every evening | ORAL | 5 refills | Status: DC

## 2020-03-03 MED ORDER — ALBUTEROL SULFATE HFA 108 (90 BASE) MCG/ACT IN AERS: 2.0000 | INHALATION_SPRAY | RESPIRATORY_TRACT | 1 refills | Status: DC | PRN

## 2020-03-03 NOTE — Progress Notes (Signed)
FOLLOW UP  Date of Service/Encounter:  03/03/20   Assessment:   Perennial allergic rhinitis  Food intolerance  Mild intermittent asthma with acute exacerbation   It is unclear what is leading to her current set of symptoms, but if I had to guess I would think that she is experiencing a mild asthma exacerbation. We are going to put her on a course of prednisone. She is going to call us if she is still feeling bad over the weekend and we can start her on antibiotics. But I anticipate that she will do very well.   Plan/Recommendations:   1. Allergic rhinitis (dust mites, mold, cat)  Continue environmental control measures.  We will send in Xyzal (levocetirizine) daily as needed.  May use fluticasone nasal spray 1-2 sprays daily as needed.  If symptoms not controlled then let us know.   Adverse food reaction - Continue to avoid soy, wheat and dairy.  Mild intermittent asthma with acute exacerbation - Lung testing looked great today. - We are going to start a prednisone pack. - Call us on Saturday if you are not improving. - We always have someone on call.   4. Return in about 6 months (around 09/03/2020). This can be an in-person, a virtual Webex or a telephone follow up visit.  Subjective:   Claudia Price is a 29 y.o. female presenting today for follow up of  Chief Complaint  Patient presents with  . Asthma  . Allergic Reaction    moldy bread    Claudia Price has a history of the following: Patient Active Problem List   Diagnosis Date Noted  . Other allergic rhinitis 07/02/2018  . Adverse food reaction 07/02/2018  . Mild intermittent asthma without complication 07/02/2018  . Vaginal birth after cesarean (VBAC) 01/11/2015  . [redacted] weeks gestation of pregnancy   . Maternal morbid obesity, antepartum (HCC)   . [redacted] weeks gestation of pregnancy   . Cervical insufficiency during pregnancy in second trimester, antepartum   . Maternal morbid obesity in  second trimester, antepartum (HCC)   . Evaluate anatomy not seen on prior sonogram   . Obesity affecting pregnancy in second trimester, antepartum   . [redacted] weeks gestation of pregnancy   . [redacted] weeks gestation of pregnancy   . Cervical insufficiency during pregnancy, antepartum   . History of pregnancy loss in prior pregnancy, currently pregnant   . Pregnant state, incidental   . Cervical incompetence, antepartum condition or complication   . Examination to determine fetal viability of pregnancy   . Cervical cerclage suture present   . Right leg swelling 02/14/2014  . Leg pain, right 02/14/2014    History obtained from: chart review and patient.  Claudia Price is a 29 y.o. female presenting for a follow up visit.  She was last seen in May 2020 by Dr. Selena Batten.  She has a history of intermittent asthma.  She was using albuterol as needed at that visit.  She has a history of allergic rhinitis with sensitizations to dust mite, mold, and cat.  She was having minimal symptoms on Flonase and over-the-counter antihistamines.  She was trialed on Xyzal at that visit.  She has a history of avoiding gluten as well as dairy and soy.  However, testing has been negative.  She gets GI issues with gluten and dairy and body aches and joint pains with soy.  Since the last visit, she has mostly done well. However, last week she ate some moldy bread which  she thinks triggered her symptoms. She reports throat swelling that resolved with Benadryl. Over the last week, she has had an off and on cough. She had an older inhaler (maybe albuterol) which she has been using. She has been getting worse with the elevated temperatures. The nagging cough is the big problem for her now.   She has a slow metabolism overall so it was later after eating the moldy bread. Her reactions tend to take a few hours to pop up. She continues to avoid gluten, soy, and dairy. She knows that this is intolerance more than an allergy. She reports feeling "mew".    She reports that she had mold in a previous home in the crawlspace. This was a similar reaction. But this was nothing to do with eating the mold. This was "turning into pneumonia". She was on antibiotics for a tooth abscess and she had the tooth removed. Otherwise she has not had any antibiotics.   Otherwise, there have been no changes to her past medical history, surgical history, family history, or social history.    Review of Systems  Constitutional: Negative.  Negative for chills, fever, malaise/fatigue and weight loss.  HENT: Positive for congestion. Negative for ear discharge, ear pain, sinus pain and sore throat.   Eyes: Negative for pain, discharge and redness.  Respiratory: Negative for cough, sputum production, shortness of breath, wheezing and stridor.   Cardiovascular: Negative.  Negative for chest pain and palpitations.  Gastrointestinal: Negative for abdominal pain, constipation, diarrhea, heartburn, nausea and vomiting.  Skin: Negative.  Negative for itching and rash.  Neurological: Negative for dizziness and headaches.  Endo/Heme/Allergies: Positive for environmental allergies. Does not bruise/bleed easily.       Objective:   Blood pressure 102/80, pulse 84, resp. rate 18, height 5\' 8"  (1.727 m), weight 289 lb 6.4 oz (131.3 kg), SpO2 97 %, unknown if currently breastfeeding. Body mass index is 44 kg/m.   Physical Exam:  Physical Exam Constitutional:      Appearance: She is well-developed.     Comments: Pleasant female. Very talkative female.   HENT:     Head: Normocephalic and atraumatic.     Right Ear: Tympanic membrane, ear canal and external ear normal.     Left Ear: Tympanic membrane, ear canal and external ear normal.     Nose: No nasal deformity, septal deviation, mucosal edema or rhinorrhea.     Right Turbinates: Enlarged and swollen.     Left Turbinates: Enlarged and swollen.     Right Sinus: No maxillary sinus tenderness or frontal sinus  tenderness.     Left Sinus: No maxillary sinus tenderness or frontal sinus tenderness.     Mouth/Throat:     Mouth: Mucous membranes are not pale and not dry.     Pharynx: Uvula midline.  Eyes:     General:        Right eye: No discharge.        Left eye: No discharge.     Conjunctiva/sclera: Conjunctivae normal.     Right eye: Right conjunctiva is not injected. No chemosis.    Left eye: Left conjunctiva is not injected. No chemosis.    Pupils: Pupils are equal, round, and reactive to light.  Cardiovascular:     Rate and Rhythm: Normal rate and regular rhythm.     Heart sounds: Normal heart sounds.  Pulmonary:     Effort: Pulmonary effort is normal. No tachypnea, accessory muscle usage or respiratory distress.  Breath sounds: Normal breath sounds. No wheezing, rhonchi or rales.     Comments: Moving air well in all lung fields. No crackles or wheezes noted.  Chest:     Chest wall: No tenderness.  Lymphadenopathy:     Cervical: No cervical adenopathy.  Skin:    Coloration: Skin is not pale.     Findings: No abrasion, erythema, petechiae or rash. Rash is not papular, urticarial or vesicular.  Neurological:     Mental Status: She is alert.      Diagnostic studies:    Spirometry: results normal (FEV1: 3.44/96%, FVC: 4.69/109%, FEV1/FVC: 73%).    Spirometry consistent with normal pattern.   Allergy Studies: none      Malachi Bonds, MD  [facility]

## 2020-03-03 NOTE — Patient Instructions (Addendum)
1. Allergic rhinitis (dust mites, mold, cat)  Continue environmental control measures.  We will send in Xyzal (levocetirizine) daily as needed.  May use fluticasone nasal spray 1-2 sprays daily as needed.  If symptoms not controlled then let us know.   Adverse food reaction - Continue to avoid soy, wheat and dairy.  Mild intermittent asthma with acute exacerbation - Lung testing looked great today. - We are going to start a prednisone pack. - Call us on Saturday if you are not improving. - We always have someone on call.   4. Return in about 6 months (around 09/03/2020). This can be an in-person, a virtual Webex or a telephone follow up visit.   Please inform us of any Emergency Department visits, hospitalizations, or changes in symptoms. Call us before going to the ED for breathing or allergy symptoms since we might be able to fit you in for a sick visit. Feel free to contact us anytime with any questions, problems, or concerns.  It was a pleasure to meet you today! Tell Asheligh I said hellooooo!    Websites that have reliable patient information: 1. American Academy of Asthma, Allergy, and Immunology: www.aaaai.org 2. Food Allergy Research and Education (FARE): foodallergy.org 3. Mothers of Asthmatics: http://www.asthmacommunitynetwork.org 4. American College of Allergy, Asthma, and Immunology: www.acaai.org   COVID-19 Vaccine Information can be found at: [email or web address] For questions related to vaccine distribution or appointments, please email [email or web address] or call [phone].     "Like" [name] on Facebook and Instagram for our latest updates!        Make sure you are registered to vote! If you have moved or changed any of your contact information, you will need to get this updated before voting!  In some cases, you MAY be able to register to vote online:  [email or web address]

## 2020-03-08 ENCOUNTER — Telehealth: Payer: Self-pay

## 2020-03-08 NOTE — Telephone Encounter (Signed)
Talked with the patient via phone. She reports dry cough which is worse at night. She reports the prednisone was helping reduce the cough. She denies fever and sick contacts. Can you please call in prednisone 10 mg tablets-2 tablets once a day for 4 days and 1 tablet on the 5th day, Symbicort 160-2 puffs twice a day, and a spacer. Please have her call the clinic if not improving. Thank you

## 2020-03-08 NOTE — Telephone Encounter (Signed)
Patient saw Dr. Dellis Anes the beginning of this month. He asked for her to call us back if she was not improving. Patient sister states that she finished her prednisone course but she is not getting any better. She states her cough still present and it gets worse when she goes outside. Patient wants to know what else can we do. Since Dr. Dellis Anes is out I was asked to send this message to you. Please advice

## 2020-03-09 ENCOUNTER — Other Ambulatory Visit: Payer: Self-pay

## 2020-03-09 MED ORDER — PREDNISONE 10 MG PO TABS
ORAL_TABLET | ORAL | 0 refills | Status: DC
Start: 2020-03-09 — End: 2020-08-05

## 2020-03-09 MED ORDER — BUDESONIDE-FORMOTEROL FUMARATE 160-4.5 MCG/ACT IN AERO: INHALATION_SPRAY | RESPIRATORY_TRACT | 5 refills | Status: DC

## 2020-03-09 NOTE — Telephone Encounter (Signed)
Medication sent in and patient was notified.  

## 2020-04-08 ENCOUNTER — Other Ambulatory Visit: Payer: Self-pay | Admitting: Family Medicine

## 2020-04-23 ENCOUNTER — Other Ambulatory Visit: Payer: Self-pay | Admitting: Allergy & Immunology

## 2020-08-04 ENCOUNTER — Other Ambulatory Visit: Payer: Self-pay

## 2020-08-04 ENCOUNTER — Ambulatory Visit (INDEPENDENT_AMBULATORY_CARE_PROVIDER_SITE_OTHER): Payer: 59 | Admitting: Allergy & Immunology

## 2020-08-04 ENCOUNTER — Encounter: Payer: Self-pay | Admitting: Allergy & Immunology

## 2020-08-04 VITALS — BP 118/76 | HR 79 | Temp 97.9°F | Resp 18 | Ht 68.0 in | Wt 299.6 lb

## 2020-08-04 DIAGNOSIS — K9049 Malabsorption due to intolerance, not elsewhere classified: Secondary | ICD-10-CM | POA: Diagnosis not present

## 2020-08-04 DIAGNOSIS — J4521 Mild intermittent asthma with (acute) exacerbation: Secondary | ICD-10-CM

## 2020-08-04 DIAGNOSIS — T781XXD Other adverse food reactions, not elsewhere classified, subsequent encounter: Secondary | ICD-10-CM

## 2020-08-04 NOTE — Progress Notes (Signed)
FOLLOW UP  Date of Service/Encounter:  08/04/20   Assessment:   Perennial allergic rhinitis  Food intolerance - additional labs sent today  Mild intermittent asthma   Plan/Recommendations:   1. Allergic rhinitis (dust mites, mold, cat)  Continue environmental control measures.  Continue with Xyzal (levocetirizine) daily as needed.  Continue with Flonase (fluticasone) nasal spray 1-2 sprays daily as needed.  If symptoms not controlled then let us know.   Adverse food reaction - Continue to avoid soy, wheat and dairy.  Mild intermittent asthma, uncomplicated - Lung testing looked great today. - We are not going to make any medication changes at this time.   4. Return in about 1 year (around 08/04/2021).   Subjective:   Jasneet Schobert is a 29 y.o. female presenting today for follow up of  Chief Complaint  Patient presents with  . Follow-up    Jorge Ny Marili Vader has a history of the following: Patient Active Problem List   Diagnosis Date Noted  . Other allergic rhinitis 07/02/2018  . Adverse food reaction 07/02/2018  . Mild intermittent asthma without complication 07/02/2018  . Vaginal birth after cesarean (VBAC) 01/11/2015  . [redacted] weeks gestation of pregnancy   . Maternal morbid obesity, antepartum (HCC)   . [redacted] weeks gestation of pregnancy   . Cervical insufficiency during pregnancy in second trimester, antepartum   . Maternal morbid obesity in second trimester, antepartum (HCC)   . Evaluate anatomy not seen on prior sonogram   . Obesity affecting pregnancy in second trimester, antepartum   . [redacted] weeks gestation of pregnancy   . [redacted] weeks gestation of pregnancy   . Cervical insufficiency during pregnancy, antepartum   . History of pregnancy loss in prior pregnancy, currently pregnant   . Pregnant state, incidental   . Cervical incompetence, antepartum condition or complication   . Examination to determine fetal viability of pregnancy   .  Cervical cerclage suture present   . Right leg swelling 02/14/2014  . Leg pain, right 02/14/2014    History obtained from: chart review and patient.  Evoleht is a 29 y.o. female presenting for a follow up visit.  She was last seen in July 2021.  At that time, we sent in Xyzal 5 mg daily to control her allergic rhinitis.  We also continue with Flonase 1 spray per nostril as needed.  She has a history of sensitizations to dust mites, mold, and cat.  We recommended continued avoidance of soy, wheat, and dairy.  Her lung testing looked fairly good, but she was experiencing chest tightness so we started some prednisone.  I asked her to call me if she was not improving, but she did fine.  Since the last visit, she has done well.    Asthma/Respiratory Symptom History: She reports that her breathing is doing well. She has not been eating moldy bread. She has not been using her rescue inhaler. Allayah's asthma has been well controlled. She has not required rescue medication, experienced nocturnal awakenings due to lower respiratory symptoms, nor have activities of daily living been limited. She has required no Emergency Department or Urgent Care visits for her asthma. She has required zero courses of systemic steroids for asthma exacerbations since the last visit. ACT score today is 25, indicating excellent asthma symptom control.   Allergic Rhinitis Symptom History: She not has not been using the Xyzal very much at all. She did adopt a cat at one point. She has a cat named Angus. They  also have four dogs. She has not needed antibiotics at all.   Food Allergy Symptom History: She is trying to limit the gluten, dairy, and soy. She has puffiness from these foods. She is requesting some additional labs today to make sure about her food allergy status. She does not have an EpiPen at all. She has never had an anaphylactic reaction.    Otherwise, there have been no changes to her past medical history, surgical  history, family history, or social history.    Review of Systems  Constitutional: Negative.  Negative for chills, fever, malaise/fatigue and weight loss.  HENT: Positive for congestion. Negative for ear discharge, ear pain and sinus pain.   Eyes: Negative for pain, discharge and redness.  Respiratory: Negative for cough, sputum production, shortness of breath and wheezing.   Cardiovascular: Negative.  Negative for chest pain and palpitations.  Gastrointestinal: Negative for abdominal pain, constipation, diarrhea, heartburn, nausea and vomiting.  Skin: Negative.  Negative for itching and rash.  Neurological: Negative for dizziness and headaches.  Endo/Heme/Allergies: Positive for environmental allergies. Does not bruise/bleed easily.       Positive for possible food allergies.       Objective:   Blood pressure 118/76, pulse 79, temperature 97.9 F (36.6 C), temperature source Temporal, resp. rate 18, height 5\' 8"  (1.727 m), weight 299 lb 9.6 oz (135.9 kg), SpO2 98 %, unknown if currently breastfeeding. Body mass index is 45.55 kg/m.   Physical Exam:  Physical Exam Constitutional:      Appearance: She is well-developed.  HENT:     Head: Normocephalic and atraumatic.     Right Ear: Tympanic membrane, ear canal and external ear normal.     Left Ear: Tympanic membrane, ear canal and external ear normal.     Nose: No nasal deformity, septal deviation, mucosal edema or rhinorrhea.     Right Turbinates: Enlarged and swollen.     Left Turbinates: Enlarged and swollen.     Right Sinus: No maxillary sinus tenderness or frontal sinus tenderness.     Left Sinus: No maxillary sinus tenderness or frontal sinus tenderness.     Mouth/Throat:     Mouth: Mucous membranes are not pale and not dry.     Pharynx: Uvula midline.  Eyes:     General:        Right eye: No discharge.        Left eye: No discharge.     Conjunctiva/sclera: Conjunctivae normal.     Right eye: Right conjunctiva is  not injected. No chemosis.    Left eye: Left conjunctiva is not injected. No chemosis.    Pupils: Pupils are equal, round, and reactive to light.  Cardiovascular:     Rate and Rhythm: Normal rate and regular rhythm.     Heart sounds: Normal heart sounds.  Pulmonary:     Effort: Pulmonary effort is normal. No tachypnea, accessory muscle usage or respiratory distress.     Breath sounds: Normal breath sounds. No wheezing, rhonchi or rales.     Comments: Moving air well in all lung fields. No increased work of breathing noted.  Chest:     Chest wall: No tenderness.  Lymphadenopathy:     Cervical: No cervical adenopathy.  Skin:    Coloration: Skin is not pale.     Findings: No abrasion, erythema, petechiae or rash. Rash is not papular, urticarial or vesicular.  Neurological:     Mental Status: She is alert.  Psychiatric:  Behavior: Behavior is cooperative.      Diagnostic studies:    Spirometry: results normal (FEV1: 3.55/94%, FVC: 4.30/95%, FEV1/FVC: 83%).    Spirometry consistent with normal pattern.   Allergy Studies: none       Malachi Bonds, MD  [facility]

## 2020-08-04 NOTE — Patient Instructions (Addendum)
1. Allergic rhinitis (dust mites, mold, cat)  Continue environmental control measures.  Continue with Xyzal (levocetirizine) daily as needed.  Continue with Flonase (fluticasone) nasal spray 1-2 sprays daily as needed.  If symptoms not controlled then let us know.   Adverse food reaction - Continue to avoid soy, wheat and dairy.  Mild intermittent asthma, uncomplicated - Lung testing looked great today. - We are not going to make any medication changes at this time.   4. Return in about 1 year (around 08/04/2021).    Please inform us of any Emergency Department visits, hospitalizations, or changes in symptoms. Call us before going to the ED for breathing or allergy symptoms since we might be able to fit you in for a sick visit. Feel free to contact us anytime with any questions, problems, or concerns.  It was a pleasure to see you again today!    Websites that have reliable patient information: 1. American Academy of Asthma, Allergy, and Immunology: www.aaaai.org 2. Food Allergy Research and Education (FARE): foodallergy.org 3. Mothers of Asthmatics: http://www.asthmacommunitynetwork.org 4. American College of Allergy, Asthma, and Immunology: www.acaai.org   COVID-19 Vaccine Information can be found at: PodExchange.nl For questions related to vaccine distribution or appointments, please email [email or web address] or call [phone].     "Like" [name] on Facebook and Instagram for our latest updates!       Make sure you are registered to vote! If you have moved or changed any of your contact information, you will need to get this updated before voting!  In some cases, you MAY be able to register to vote online: [email or web address]

## 2020-08-05 ENCOUNTER — Encounter: Payer: Self-pay | Admitting: Allergy & Immunology

## 2020-08-07 LAB — ALLERGEN BARLEY F6: Allergen Barley IgE: <0.1 kU/L

## 2020-08-07 LAB — ALLERGEN SESAME F10: Sesame Seed IgE: <0.1 kU/L

## 2020-08-07 LAB — ALLERGEN, SWEET POTATO,F54: Allergen Sweet Potato IgE: <0.1 kU/L

## 2020-08-07 LAB — F245-IGE EGG, WHOLE: Egg, Whole IgE: <0.1 kU/L

## 2020-08-07 LAB — ALLERGENS(7)
Brazil Nut IgE: <0.1 kU/L
F020-IgE Almond: <0.1 kU/L
F202-IgE Cashew Nut: <0.1 kU/L
Hazelnut (Filbert) IgE: <0.1 kU/L
Peanut IgE: <0.1 kU/L
Pecan Nut IgE: <0.1 kU/L
Walnut IgE: <0.1 kU/L

## 2020-08-07 LAB — ALLERGEN, RYE, F5: Rye IgE: <0.1 kU/L

## 2020-08-07 LAB — K083-IGE COTTONSEED: K083-IgE Cottonseed: <0.1 kU/L

## 2020-08-07 LAB — ALLERGEN, CORN F8: Allergen Corn, IgE: <0.1 kU/L

## 2020-08-07 LAB — ALLERGEN, WHITE POTATO,F35: Allergen Potato, White IgE: <0.1 kU/L

## 2020-08-07 LAB — ALLERGEN, TOMATO F25: Allergen Tomato, IgE: <0.1 kU/L

## 2020-08-07 LAB — ALLERGEN,OAT,F7: Allergen Oat IgE: <0.1 kU/L

## 2020-08-07 LAB — ALLERGEN, HOPS/FRUIT CONE, IGE: Hops: <0.1 kU/L

## 2020-08-07 LAB — ALLERGEN, WHEAT, F4: Wheat IgE: <0.1 kU/L

## 2020-08-07 LAB — ALLERGEN MILK: Milk IgE: <0.1 kU/L

## 2020-08-07 LAB — ALLERGEN, CASEIN, F78: F078-IgE Casein: <0.1 kU/L

## 2020-09-08 ENCOUNTER — Other Ambulatory Visit: Payer: Self-pay | Admitting: Allergy & Immunology

## 2020-09-30 ENCOUNTER — Other Ambulatory Visit: Payer: Self-pay | Admitting: Allergy & Immunology

## 2020-10-02 ENCOUNTER — Other Ambulatory Visit: Payer: Self-pay | Admitting: Allergy & Immunology

## 2020-10-10 ENCOUNTER — Telehealth: Payer: Self-pay

## 2020-10-10 ENCOUNTER — Other Ambulatory Visit: Payer: Self-pay | Admitting: Family Medicine

## 2020-10-10 NOTE — Telephone Encounter (Signed)
Called to see is patient needed a refill of th Symbicort since she was using it for breakthrough symptoms.

## 2020-10-11 ENCOUNTER — Ambulatory Visit (INDEPENDENT_AMBULATORY_CARE_PROVIDER_SITE_OTHER): Payer: 59 | Admitting: Allergy & Immunology

## 2020-10-11 ENCOUNTER — Other Ambulatory Visit: Payer: Self-pay

## 2020-10-11 ENCOUNTER — Encounter: Payer: Self-pay | Admitting: Allergy & Immunology

## 2020-10-11 VITALS — BP 110/60 | HR 65 | Temp 98.7°F | Resp 18 | Ht 68.0 in | Wt 302.8 lb

## 2020-10-11 DIAGNOSIS — J4521 Mild intermittent asthma with (acute) exacerbation: Secondary | ICD-10-CM

## 2020-10-11 DIAGNOSIS — J3089 Other allergic rhinitis: Secondary | ICD-10-CM

## 2020-10-11 DIAGNOSIS — K9049 Malabsorption due to intolerance, not elsewhere classified: Secondary | ICD-10-CM

## 2020-10-11 NOTE — Patient Instructions (Addendum)
1. Allergic rhinitis (dust mites, mold, cat) - Continue with Xyzal (levocetirizine) daily as needed. - Continue with Flonase (fluticasone) nasal spray 1-2 sprays daily as needed. - If symptoms not controlled then let us know.   2. Adverse food reaction  - Continue to avoid all of your triggering foods.   3. Mild intermittent asthma, uncomplicated - Lung testing looked great today. - Start the prednisone burst provided.  - I think we just need to be aggressive with the controller medications in the winter months.  - Use the Symbicort two puffs twice daily every day until we see you again. - Continue with albuterol 4 puffs every 4-6 hours as needed.   - I think we might just need to be aggressive during the winter month with controller medications.   4. Return in about 3 months (around 01/08/2021).    Please inform us of any Emergency Department visits, hospitalizations, or changes in symptoms. Call us before going to the ED for breathing or allergy symptoms since we might be able to fit you in for a sick visit. Feel free to contact us anytime with any questions, problems, or concerns.  It was a pleasure to see you again today!    Websites that have reliable patient information: 1. American Academy of Asthma, Allergy, and Immunology: www.aaaai.org 2. Food Allergy Research and Education (FARE): foodallergy.org 3. Mothers of Asthmatics: http://www.asthmacommunitynetwork.org 4. American College of Allergy, Asthma, and Immunology: www.acaai.org   COVID-19 Vaccine Information can be found at: [email or web address] For questions related to vaccine distribution or appointments, please email [email or web address] or call [phone].     "Like" [name] on Facebook and Instagram for our latest updates!       Make sure you are registered to vote! If you have moved or changed any of your contact information, you will need to get this  updated before voting!  In some cases, you MAY be able to register to vote online: [email or web address]

## 2020-10-11 NOTE — Progress Notes (Unsigned)
FOLLOW UP  Date of Service/Encounter:  10/11/20   Assessment:   Perennial allergic rhinitis  Food intolerance - additional labs sent today  Mild intermittent asthma with acute exacerbation - seems to be seasonal in the winter months  Plan/Recommendations:   1. Allergic rhinitis (dust mites, mold, cat) - Continue with Xyzal (levocetirizine) daily as needed. - Continue with Flonase (fluticasone) nasal spray 1-2 sprays daily as needed. - If symptoms not controlled then let us know.   2. Adverse food reaction  - Continue to avoid all of your triggering foods.   3. Mild intermittent asthma, uncomplicated - Lung testing looked great today. - Start the prednisone burst provided.  - I think we just need to be aggressive with the controller medications in the winter months.  - Use the Symbicort two puffs twice daily every day until we see you again. - Continue with albuterol 4 puffs every 4-6 hours as needed.   - I think we might just need to be aggressive during the winter month with controller medications.   4. Return in about 3 months (around 01/08/2021).   Subjective:   Claudia Price is a 30 y.o. female presenting today for follow up of  Chief Complaint  Patient presents with  . Asthma  . Cough  . Allergic Rhinitis     Cat exposure has a new cat in the home    Claudia Price has a history of the following: Patient Active Problem List   Diagnosis Date Noted  . Other allergic rhinitis 07/02/2018  . Adverse food reaction 07/02/2018  . Mild intermittent asthma without complication 07/02/2018  . Vaginal birth after cesarean (VBAC) 01/11/2015  . [redacted] weeks gestation of pregnancy   . Maternal morbid obesity, antepartum (HCC)   . [redacted] weeks gestation of pregnancy   . Cervical insufficiency during pregnancy in second trimester, antepartum   . Maternal morbid obesity in second trimester, antepartum (HCC)   . Evaluate anatomy not seen on prior sonogram   . Obesity  affecting pregnancy in second trimester, antepartum   . [redacted] weeks gestation of pregnancy   . [redacted] weeks gestation of pregnancy   . Cervical insufficiency during pregnancy, antepartum   . History of pregnancy loss in prior pregnancy, currently pregnant   . Pregnant state, incidental   . Cervical incompetence, antepartum condition or complication   . Examination to determine fetal viability of pregnancy   . Cervical cerclage suture present   . Right leg swelling 02/14/2014  . Leg pain, right 02/14/2014    History obtained from: chart review and patient.  Claudia Price is a 30 y.o. female presenting for a sick visit.  She was last seen in December 2021.  At that time, we continue with Xyzal as well as Flonase as well as environmental control measures for dust mites, mold, and cat.  We recommended continued avoidance of soy, wheat, and dairy.  For her asthma, her testing looked good.  We decided not to do any further medications at that time. We also obtained a number of other labs because of concern for food allergies and all of these were negative.  Since the last visit, she has mostly done well.   She has been sick since January 2022. She has had coughing that has worsened in January. She has Symbicort and she has been using it when the cough gets particularly bad. She has been using it 1-2 times daily. Cough seems to get worse at night. She does have  issues when she laughs particularly hard. She has not been on prednisone during this period of time. She has issues with breathing during the winter months consistently for years. In the past, she was uninsured and never got it checked out. She is unsure whether this is just the winter.   However, over the past year, her symptoms seem to be well controlled.  In fact, on my previous visit, she has been on albuterol on a very rare basis only.  She has never been on a regular controller medication on a very routine basis.  It is unclear how often if ever she  has needed prednisone.  She does have a crawl space. She is unsure of any water issues in the crawl space.  She did previously live in a place that had a bunch of mold in the home.  This was a rental house and the landlord sounds like he was scum.  Otherwise, there have been no changes to her past medical history, surgical history, family history, or social history.    Review of Systems  Constitutional: Negative.  Negative for chills, fever, malaise/fatigue and weight loss.  HENT: Positive for congestion. Negative for ear discharge, ear pain and sinus pain.   Eyes: Negative for pain, discharge and redness.  Respiratory: Positive for cough, shortness of breath and wheezing. Negative for sputum production.   Cardiovascular: Negative.  Negative for chest pain and palpitations.  Gastrointestinal: Negative for abdominal pain, constipation, diarrhea, heartburn, nausea and vomiting.  Skin: Negative.  Negative for itching and rash.  Neurological: Negative for dizziness and headaches.  Endo/Heme/Allergies: Negative for environmental allergies. Does not bruise/bleed easily.       Objective:   Blood pressure 110/60, pulse 65, temperature 98.7 F (37.1 C), temperature source Temporal, resp. rate 18, height 5\' 8"  (1.727 m), weight (!) 302 lb 12.8 oz (137.3 kg), SpO2 97 %, unknown if currently breastfeeding. Body mass index is 46.04 kg/m.   Physical Exam:  Physical Exam Constitutional:      Appearance: She is well-developed.  HENT:     Head: Normocephalic and atraumatic.     Right Ear: Tympanic membrane, ear canal and external ear normal.     Left Ear: Tympanic membrane, ear canal and external ear normal.     Nose: No nasal deformity, septal deviation, mucosal edema, rhinorrhea or epistaxis.     Right Turbinates: Enlarged and swollen.     Left Turbinates: Enlarged and swollen.     Right Sinus: No maxillary sinus tenderness or frontal sinus tenderness.     Left Sinus: No maxillary sinus  tenderness or frontal sinus tenderness.     Comments: No sinus tenderness.  No purulent discharge.    Mouth/Throat:     Mouth: Oropharynx is clear and moist. Mucous membranes are not pale and not dry.     Pharynx: Uvula midline.  Eyes:     General:        Right eye: No discharge.        Left eye: No discharge.     Extraocular Movements: EOM normal.     Conjunctiva/sclera: Conjunctivae normal.     Right eye: Right conjunctiva is not injected. No chemosis.    Left eye: Left conjunctiva is not injected. No chemosis.    Pupils: Pupils are equal, round, and reactive to light.  Cardiovascular:     Rate and Rhythm: Normal rate and regular rhythm.     Heart sounds: Normal heart sounds.  Pulmonary:  Effort: Pulmonary effort is normal. No tachypnea, accessory muscle usage or respiratory distress.     Breath sounds: Normal breath sounds. No wheezing, rhonchi or rales.     Comments: Decreased air movement at the bases.  Faint wheezing heard throughout. Chest:     Chest wall: No tenderness.  Lymphadenopathy:     Cervical: No cervical adenopathy.  Skin:    Coloration: Skin is not pale.     Findings: No abrasion, erythema, petechiae or rash. Rash is not papular, urticarial or vesicular.  Neurological:     Mental Status: She is alert.  Psychiatric:        Mood and Affect: Mood and affect normal.      Diagnostic studies:    Spirometry: results normal (FEV1: 3.49/97%, FVC: 4.64/108%, FEV1/FVC: 75%).    Spirometry consistent with normal pattern.   Allergy Studies: none        Malachi Bonds, MD  [facility]

## 2020-10-12 ENCOUNTER — Encounter: Payer: Self-pay | Admitting: Allergy & Immunology

## 2020-10-25 ENCOUNTER — Other Ambulatory Visit: Payer: Self-pay | Admitting: Family Medicine

## 2020-11-18 ENCOUNTER — Other Ambulatory Visit: Payer: Self-pay | Admitting: General Surgery

## 2020-11-18 ENCOUNTER — Other Ambulatory Visit (HOSPITAL_COMMUNITY): Payer: Self-pay | Admitting: General Surgery

## 2020-11-24 ENCOUNTER — Ambulatory Visit (HOSPITAL_COMMUNITY)
Admission: RE | Admit: 2020-11-24 | Discharge: 2020-11-24 | Disposition: A | Discharge status: Home / Self Care | Payer: 59 | Source: Ambulatory Visit | Attending: General Surgery | Admitting: General Surgery

## 2020-11-24 ENCOUNTER — Other Ambulatory Visit: Payer: Self-pay

## 2020-12-09 ENCOUNTER — Other Ambulatory Visit: Payer: Self-pay | Admitting: Allergy & Immunology

## 2020-12-29 ENCOUNTER — Other Ambulatory Visit: Payer: Self-pay

## 2020-12-29 ENCOUNTER — Encounter: Payer: Self-pay | Admitting: Skilled Nursing Facility1

## 2020-12-29 ENCOUNTER — Encounter: Payer: 59 | Attending: General Surgery | Admitting: Skilled Nursing Facility1

## 2020-12-29 DIAGNOSIS — E669 Obesity, unspecified: Secondary | ICD-10-CM | POA: Diagnosis present

## 2020-12-29 NOTE — Progress Notes (Signed)
Nutrition Assessment for Bariatric Surgery Medical Nutrition Therapy Appt Start Time:  4:00 End Time: 5:00  Patient was seen on 12/29/2020 for Pre-Operative Nutrition Assessment. Letter of approval faxed to [facility] bariatric surgery program coordinator on 12/29/2020  Referral stated Supervised Weight Loss (SWL) visits needed: 0  Pt completed visits.   Pt has cleared nutrition requirements.   Planned surgery: RYGB Pt expectation of surgery: to lose weight Pt expectation of dietitian: to help support  NUTRITION ASSESSMENT   Anthropometrics  Start weight at [facility]: 288.8 lbs (date: 12/29/2020)  Height: 69 in BMI: 42.65  kg/m2     Clinical  Medical hx: Anemia Medications: Thyroid Labs:  Notable signs/symptoms: intolerance to soy, dairy, wheat (feeling puffy or bloated) Any previous deficiencies? Iron  Micronutrient Nutrition Focused Physical Exam: Hair: No issues observed Eyes: No issues observed Mouth: No issues observed Neck: No issues observed Nails: No issues observed Skin: No issues observed  Lifestyle & Dietary Hx  Pt arrives with her seemingly supportive husband.  Pt states she has been tracking her foods and working out.  Pt states she goes to the Gym 3 days a week 60 minutes weight training.  Pt states she has a food scale and measures her food.  Pt state she has been [place] working on portion sizes and stopping at satisfaction rather than fullness.  Pt states she is aware of mindless snacking and is actively working on it.   24-Hr Dietary Recall First Meal: skipped or 2 eggs + bell pepper or protein shake  Snack: fruit Second Meal: can of chicken and rice soup + 2 eggs  Snack: almonds Third Meal: chicken + quinoa + broccoli Snack:  Beverages: water, high caffeine coffee   Estimated Energy Needs Calories: 1800  NUTRITION DIAGNOSIS  Overweight/obesity ([place]-3.3) related to past poor dietary habits and physical inactivity as evidenced by  patient w/ planned RYGB surgery following dietary guidelines for continued weight loss.   NUTRITION INTERVENTION  Nutrition counseling (C-1) and education (E-2) to facilitate bariatric surgery goals.   Pre-Op Goals Reviewed with the Patient . Track food and beverage intake (pen and paper, MyFitness Pal, Baritastic app, etc.) . Make healthy food choices while monitoring portion sizes . Consume 3 meals per day or try to eat every 3-5 hours . Avoid concentrated sugars and fried foods . Keep sugar & fat in the single digits per serving on food labels . Practice CHEWING your food (aim for applesauce consistency) . Practice not drinking 15 minutes before, during, and 30 minutes after each meal and snack . Avoid all carbonated beverages (ex: soda, sparkling beverages)  . Limit caffeinated beverages (ex: coffee, tea, energy drinks) . Avoid all sugar-sweetened beverages (ex: regular soda, sports drinks)  . Avoid alcohol  . Aim for 64-100 ounces of FLUID daily (with at least half of fluid intake being plain water)  . Aim for at least 60-80 grams of PROTEIN daily . Look for a liquid protein source that contains ?15 g protein and ?5 g carbohydrate (ex: shakes, drinks, shots) . Make a list of non-food related activities . Physical activity is an important part of a healthy lifestyle so keep it moving! The goal is to reach 150 minutes of exercise per week, including cardiovascular and weight baring activity.  *Goals that are bolded indicate the pt would like to start working towards these  Handouts Provided Include  . Bariatric Surgery handouts (Nutrition Visits, Pre-Op Goals, Protein Shakes, Vitamins & Minerals)  Learning Style & Readiness  for Change Teaching method utilized: Visual & Auditory  Demonstrated degree of understanding via: Teach Back  Readiness Level: Action Barriers to learning/adherence to lifestyle change: none identified    MONITORING & EVALUATION Dietary intake, weekly  physical activity, body weight, and pre-op goals reached at next nutrition visit.    Next Steps  Patient is to follow up at [facility] for Pre-Op Class >2 weeks before surgery for further nutrition education.  Pt has completed visits. No further supervised visits required/recomended

## 2021-01-17 ENCOUNTER — Ambulatory Visit: Payer: 59 | Admitting: Allergy & Immunology

## 2021-02-06 ENCOUNTER — Other Ambulatory Visit: Payer: Self-pay

## 2021-02-06 ENCOUNTER — Encounter: Payer: 59 | Attending: General Surgery | Admitting: Skilled Nursing Facility1

## 2021-02-06 ENCOUNTER — Other Ambulatory Visit: Payer: Self-pay | Admitting: Allergy & Immunology

## 2021-02-06 DIAGNOSIS — E669 Obesity, unspecified: Secondary | ICD-10-CM | POA: Diagnosis not present

## 2021-02-06 NOTE — Progress Notes (Signed)
Pre-Operative Nutrition Class:    Patient was seen on 02/06/2021 for Pre-Operative Bariatric Surgery Education at [facility]    Surgery date: 02/20/2021 Surgery type: RYGB Start weight at [facility]: 288.8 Weight today: 283.4  Samples given per MNT protocol. Patient educated on appropriate usage: Bariatric advantage Vitamins Multivitamin Lot # [id number] Exp: 09/2021  procare Vitamins Calcium  Lot # [id number] Exp: 01/23   Protein  Shake Lot #  [id number] Exp:  12/2021  The following the learning objectives were met by the patient during this course:  Identify Pre-Op Dietary Goals and will begin 2 weeks pre-operatively  Identify appropriate sources of fluids and proteins   State protein recommendations and appropriate sources pre and post-operatively  Identify Post-Operative Dietary Goals and will follow for 2 weeks post-operatively  Identify appropriate multivitamin and calcium sources  Describe the need for physical activity post-operatively and will follow MD recommendations  State when to call healthcare provider regarding medication questions or post-operative complications  When having a diagnosis of diabetes understanding hypoglycemia symptoms and the inclusion of 1 complex carbohydrate per meal  Handouts given during class include:  Pre-Op Bariatric Surgery Diet Handout  Protein Shake Handout  Post-Op Bariatric Surgery Nutrition Handout  [facility] Program Information Flyer  Support Group Information Flyer  [facility] Bariatric Supplements Price List  Follow-Up Plan: Patient will follow-up at [facility] 2 weeks post operatively for diet advancement per MD.

## 2021-02-07 ENCOUNTER — Other Ambulatory Visit (HOSPITAL_COMMUNITY): Payer: Self-pay

## 2021-02-08 ENCOUNTER — Ambulatory Visit: Payer: Self-pay | Admitting: General Surgery

## 2021-02-10 NOTE — Patient Instructions (Addendum)
DUE TO COVID-19 ONLY ONE VISITOR IS ALLOWED TO COME WITH YOU AND STAY IN THE WAITING ROOM ONLY DURING PRE OP AND PROCEDURE.   **NO VISITORS ARE ALLOWED IN THE SHORT STAY AREA OR RECOVERY ROOM!!**  IF YOU WILL BE ADMITTED INTO THE HOSPITAL YOU ARE ALLOWED ONLY TWO SUPPORT PEOPLE DURING VISITATION HOURS ONLY (10AM -8PM)   The support person(s) may change daily. The support person(s) must pass our screening, gel in and out, and wear a mask at all times, including in the patient's room. Patients must also wear a mask when staff or their support person are in the room.  No visitors under the age of 26. Any visitor under the age of 68 must be accompanied by an adult.    COVID SWAB TESTING MUST BE COMPLETED ON:  02/16/21 @   [address] [place], [place] [address]   You are not required to quarantine, however you are required to wear a well-fitted mask when you are out and around people not in your household.  Hand Hygiene often Do NOT share personal items Notify your provider if you are in close contact with someone who has COVID or you develop fever 100.4 or greater, new onset of sneezing, cough, sore throat, shortness of breath or body aches.       Your procedure is scheduled on: 02/20/21   Report to [facility] Main  Entrance    Report to admitting at 8:15 AM   Call this number if you have problems the morning of surgery [phone]   Do not eat food :After 6:00 PM  the day before your surgery   May have liquids until 7:15 AM day of surgery  CLEAR LIQUID DIET  Foods Allowed                                                                     Foods Excluded  Water, Black Coffee and tea, regular and decaf               liquids that you cannot  Plain Jell-O in any flavor  (No red)                                     see through such as: Fruit ices (not with fruit pulp)                                             milk, soups, orange juice              Iced Popsicles (No red)                                                  All solid food  Apple juices Sports drinks like Gatorade (No red) Lightly seasoned clear broth or consume(fat free) Sugar, honey syrup      Complete one G2 drink the morning of surgery at 7:15 AM the day of surgery.      The day of surgery:  Drink ONE (1) G2 by am the morning of surgery. Drink in one sitting. Do not sip.  This drink was given to you during your hospital  pre-op appointment visit. Nothing else to drink after completing the  G2.          If you have questions, please contact your surgeon's office.     Oral Hygiene is also important to reduce your risk of infection.                                    Remember - BRUSH YOUR TEETH THE MORNING OF SURGERY WITH YOUR REGULAR TOOTHPASTE   Do NOT smoke after Midnight   Take these medicines the morning of surgery with A SIP OF WATER: Okay to use inhalers and bring them with you              You may not have any metal on your body including hair pins, jewelry, and body piercing             Do not wear make-up, lotions, powders, perfumes, or deodorant  Do not wear nail polish including gel and S&S, artificial/acrylic nails, or any other type of covering on natural nails including finger and toenails. If you have artificial nails, gel coating, etc. that needs to be removed by a nail salon please have this removed prior to surgery or surgery may need to be canceled/ delayed if the surgeon/ anesthesia feels like they are unable to be safely monitored.   Do not shave  48 hours prior to surgery.    Do not bring valuables to the hospital. [facility] IS NOT             RESPONSIBLE   FOR VALUABLES.   Contacts, dentures or bridgework may not be worn into surgery.   Bring small overnight bag day of surgery.  Special Instructions: Bring a copy of your healthcare power of attorney and living will documents         the day of surgery if you haven't scanned  them in before.              Please read over the following fact sheets you were given: IF YOU HAVE QUESTIONS ABOUT YOUR PRE OP INSTRUCTIONS PLEASE CALL [phone]   [facility] - Preparing for Surgery Before surgery, you can play an important role.  Because skin is not sterile, your skin needs to be as free of germs as possible.  You can reduce the number of germs on your skin by washing with CHG (chlorahexidine gluconate) soap before surgery.  CHG is an antiseptic cleaner which kills germs and bonds with the skin to continue killing germs even after washing. Please DO NOT use if you have an allergy to CHG or antibacterial soaps.  If your skin becomes reddened/irritated stop using the CHG and inform your nurse when you arrive at Short Stay. Do not shave (including legs and underarms) for at least 48 hours prior to the first CHG shower.  You may shave your face/neck.  Please follow these instructions carefully:  1.  Shower with CHG Soap the night  before surgery and the  morning of surgery.  2.  If you choose to wash your hair, wash your hair first as usual with your normal  shampoo.  3.  After you shampoo, rinse your hair and body thoroughly to remove the shampoo.                             4.  Use CHG as you would any other liquid soap.  You can apply chg directly to the skin and wash.  Gently with a scrungie or clean washcloth.  5.  Apply the CHG Soap to your body ONLY FROM THE NECK DOWN.   Do   not use on face/ open                           Wound or open sores. Avoid contact with eyes, ears mouth and   genitals (private parts).                       Wash face,  Genitals (private parts) with your normal soap.             6.  Wash thoroughly, paying special attention to the area where your    surgery  will be performed.  7.  Thoroughly rinse your body with warm water from the neck down.  8.  DO NOT shower/wash with your normal soap after using and rinsing off the CHG Soap.                9.   Pat yourself dry with a clean towel.            10.  Wear clean pajamas.            11.  Place clean sheets on your bed the night of your first shower and do not  sleep with pets. Day of Surgery : Do not apply any lotions/deodorants the morning of surgery.  Please wear clean clothes to the hospital/surgery center.  FAILURE TO FOLLOW THESE INSTRUCTIONS MAY RESULT IN THE CANCELLATION OF YOUR SURGERY  PATIENT SIGNATURE_________________________________  NURSE SIGNATURE__________________________________     Incentive Spirometer  An incentive spirometer is a tool that can help keep your lungs clear and active. This tool measures how well you are filling your lungs with each breath. Taking long deep breaths may help reverse or decrease the chance of developing breathing (pulmonary) problems (especially infection) following: A long period of time when you are unable to move or be active. BEFORE THE PROCEDURE  If the spirometer includes an indicator to show your best effort, your nurse or respiratory therapist will set it to a desired goal. If possible, sit up straight or lean slightly forward. Try not to slouch. Hold the incentive spirometer in an upright position. INSTRUCTIONS FOR USE  Sit on the edge of your bed if possible, or sit up as far as you can in bed or on a chair. Hold the incentive spirometer in an upright position. Breathe out normally. Place the mouthpiece in your mouth and seal your lips tightly around it. Breathe in slowly and as deeply as possible, raising the piston or the ball toward the top of the column. Hold your breath for 3-5 seconds or for as long as possible. Allow the piston or ball to fall to the bottom of the column. Remove the mouthpiece from your mouth and  breathe out normally. Rest for a few seconds and repeat Steps 1 through 7 at least 10 times every 1-2 hours when you are awake. Take your time and take a few normal breaths between deep breaths. The  spirometer may include an indicator to show your best effort. Use the indicator as a goal to work toward during each repetition. After each set of 10 deep breaths, practice coughing to be sure your lungs are clear. If you have an incision (the cut made at the time of surgery), support your incision when coughing by placing a pillow or rolled up towels firmly against it. Once you are able to get out of bed, walk around indoors and cough well. You may stop using the incentive spirometer when instructed by your caregiver.  RISKS AND COMPLICATIONS Take your time so you do not get dizzy or light-headed. If you are in pain, you may need to take or ask for pain medication before doing incentive spirometry. It is harder to take a deep breath if you are having pain. AFTER USE Rest and breathe slowly and easily. It can be helpful to keep track of a log of your progress. Your caregiver can provide you with a simple table to help with this. If you are using the spirometer at home, follow these instructions: SEEK MEDICAL CARE IF:  You are having difficultly using the spirometer. You have trouble using the spirometer as often as instructed. Your pain medication is not giving enough relief while using the spirometer. You develop fever of 100.5 F (38.1 C) or higher. SEEK IMMEDIATE MEDICAL CARE IF:  You cough up bloody sputum that had not been present before. You develop fever of 102 F (38.9 C) or greater. You develop worsening pain at or near the incision site. MAKE SURE YOU:  Understand these instructions. Will watch your condition. Will get help right away if you are not doing well or get worse. Document Released: 12/31/2006 Document Revised: 11/12/2011 Document Reviewed: 03/03/2007 [facility] Patient Information 2014 [place], [place].   ________________________________________________________________________

## 2021-02-14 ENCOUNTER — Other Ambulatory Visit: Payer: Self-pay

## 2021-02-14 ENCOUNTER — Encounter (HOSPITAL_COMMUNITY)
Admission: RE | Admit: 2021-02-14 | Discharge: 2021-02-14 | Disposition: A | Discharge status: Home / Self Care | Payer: 59 | Source: Ambulatory Visit | Attending: General Surgery | Admitting: General Surgery

## 2021-02-14 ENCOUNTER — Encounter (HOSPITAL_COMMUNITY): Payer: Self-pay

## 2021-02-14 DIAGNOSIS — Z01812 Encounter for preprocedural laboratory examination: Secondary | ICD-10-CM | POA: Insufficient documentation

## 2021-02-14 HISTORY — DX: Other complications of anesthesia, initial encounter: T88.59XA

## 2021-02-14 HISTORY — DX: Pneumonia, unspecified organism: J18.9

## 2021-02-14 HISTORY — DX: Depression, unspecified: F32.A

## 2021-02-14 HISTORY — DX: Hypothyroidism, unspecified: E03.9

## 2021-02-14 LAB — COMPREHENSIVE METABOLIC PANEL
ALT: 17 U/L (ref 0–44)
AST: 18 U/L (ref 15–41)
Albumin: 4.6 g/dL (ref 3.5–5.0)
Alkaline Phosphatase: 42 U/L (ref 38–126)
Anion gap: 8 (ref 5–15)
BUN: 15 mg/dL (ref 6–20)
CO2: 27 mmol/L (ref 22–32)
Calcium: 9.3 mg/dL (ref 8.9–10.3)
Chloride: 104 mmol/L (ref 98–111)
Creatinine, Ser: 0.73 mg/dL (ref 0.44–1.00)
GFR, Estimated: >60 mL/min (ref >60)
Glucose, Bld: 92 mg/dL (ref 70–99)
Potassium: 4 mmol/L (ref 3.5–5.1)
Sodium: 139 mmol/L (ref 135–145)
Total Bilirubin: 0.6 mg/dL (ref 0.3–1.2)
Total Protein: 7.8 g/dL (ref 6.5–8.1)

## 2021-02-14 LAB — CBC WITH DIFFERENTIAL/PLATELET
Abs Immature Granulocytes: 0.01 10*3/uL (ref 0.00–0.07)
Basophils Absolute: 0.1 10*3/uL (ref 0.0–0.1)
Basophils Relative: 1 %
Eosinophils Absolute: 0.2 10*3/uL (ref 0.0–0.5)
Eosinophils Relative: 3 %
HCT: 37.9 % (ref 36.0–46.0)
Hemoglobin: 12.5 g/dL (ref 12.0–15.0)
Immature Granulocytes: 0 %
Lymphocytes Relative: 25 %
Lymphs Abs: 1.6 10*3/uL (ref 0.7–4.0)
MCH: 28.9 pg (ref 26.0–34.0)
MCHC: 33 g/dL (ref 30.0–36.0)
MCV: 87.7 fL (ref 80.0–100.0)
Monocytes Absolute: 0.4 10*3/uL (ref 0.1–1.0)
Monocytes Relative: 6 %
Neutro Abs: 4.4 10*3/uL (ref 1.7–7.7)
Neutrophils Relative %: 65 %
Platelets: 226 10*3/uL (ref 150–400)
RBC: 4.32 MIL/uL (ref 3.87–5.11)
RDW: 13.3 % (ref 11.5–15.5)
WBC: 6.6 10*3/uL (ref 4.0–10.5)
nRBC: 0 % (ref 0.0–0.2)

## 2021-02-14 NOTE — Progress Notes (Addendum)
PCP - Burke Keels , MD  [facility]  Physcians [place]  Cardiologist - no  PPM/ICD -  Device Orders -  Rep Notified -   Chest x-ray - 11-25-20 epic EKG - 11-24-20   epic Stress Test -  ECHO -  Cardiac Cath -   Sleep Study -  CPAP -   Fasting Blood Sugar -  Checks Blood Sugar _____ times a day  Blood Thinner Instructions: Aspirin Instructions:  ERAS Protcol - PRE-SURGERY Ensure or G2-   COVID TEST- 02-16-21  Activity--Able to walk a flight of stairs without SOB  Anesthesia review:   Patient denies shortness of breath, fever, cough and chest pain at PAT appointment   All instructions explained to the patient, with a verbal understanding of the material. Patient agrees to go over the instructions while at home for a better understanding. Patient also instructed to self quarantine after being tested for COVID-19. The opportunity to ask questions was provided.

## 2021-02-15 ENCOUNTER — Encounter (HOSPITAL_COMMUNITY): Payer: Self-pay | Admitting: Physician Assistant

## 2021-02-16 ENCOUNTER — Other Ambulatory Visit (HOSPITAL_COMMUNITY)
Admission: RE | Admit: 2021-02-16 | Discharge: 2021-02-16 | Disposition: A | Discharge status: Home / Self Care | Payer: 59 | Source: Ambulatory Visit | Attending: General Surgery | Admitting: General Surgery

## 2021-02-16 DIAGNOSIS — Z01812 Encounter for preprocedural laboratory examination: Secondary | ICD-10-CM | POA: Diagnosis not present

## 2021-02-16 DIAGNOSIS — U071 COVID-19: Secondary | ICD-10-CM | POA: Insufficient documentation

## 2021-02-16 LAB — SARS CORONAVIRUS 2 (TAT 6-24 HRS): SARS Coronavirus 2: POSITIVE — AB

## 2021-02-17 ENCOUNTER — Telehealth (HOSPITAL_COMMUNITY): Payer: Self-pay

## 2021-02-17 NOTE — Telephone Encounter (Signed)
0617/22 - Contacted surgeon's office - spoke to [place] - advised of patient's positive COVID19 lab results. MBM

## 2021-02-19 MED ORDER — CEFOTETAN DISODIUM 2 G IJ SOLR
2.0000 g | INTRAMUSCULAR | Status: AC
  Filled 2021-02-19: qty 2

## 2021-02-19 MED ORDER — BUPIVACAINE LIPOSOME 1.3 % IJ SUSP
20.0000 mL | INTRAMUSCULAR | Status: AC
  Filled 2021-02-19: qty 20

## 2021-02-20 LAB — TYPE AND SCREEN
ABO/RH(D): A POS
Antibody Screen: NEGATIVE

## 2021-02-20 NOTE — Progress Notes (Addendum)
Contacted pt for Dr Sheliah Hatch regarding Covid Positive result. Plan is to find documentation of Covid +  home test from urgent care in May. If able to obtain will go forward with surgery. If can not will need to reschedule.  Pt will call back and advise   Called [name] back [phone]- No document available that says covid + @ the urgent care. She will call the office to reschedule for next week.  Dr Sheliah Hatch advised.

## 2021-03-07 ENCOUNTER — Ambulatory Visit

## 2021-03-13 NOTE — Patient Instructions (Signed)
DUE TO COVID-19 ONLY ONE VISITOR IS ALLOWED TO COME WITH YOU AND STAY IN THE WAITING ROOM ONLY DURING PRE OP AND PROCEDURE DAY OF SURGERY. THE 1 VISITOR  MAY VISIT WITH YOU AFTER SURGERY IN YOUR PRIVATE ROOM DURING VISITING HOURS ONLY!               PRISTINE GLADHILL   Your procedure is scheduled on: 03/21/21   Report to [facility] Main  Entrance   Report to admitting at : 9:00 AM     Call this number if you have problems the morning of surgery [phone]    Remember:  MORNING OF SURGERY DRINK:   DRINK 1 G2 drink BEFORE YOU LEAVE HOME (AT: 8:00 AM), DRINK ALL OF THE  G2 DRINK AT ONE TIME.   NO SOLID FOOD AFTER 600 PM THE NIGHT BEFORE YOUR SURGERY. YOU MAY DRINK CLEAR FLUIDS. THE G2 DRINK YOU DRINK BEFORE YOU LEAVE HOME WILL BE THE LAST FLUIDS YOU DRINK BEFORE SURGERY.  PAIN IS EXPECTED AFTER SURGERY AND WILL NOT BE COMPLETELY ELIMINATED. AMBULATION AND TYLENOL WILL HELP REDUCE INCISIONAL AND GAS PAIN. MOVEMENT IS KEY!  YOU ARE EXPECTED TO BE OUT OF BED WITHIN 4 HOURS OF ADMISSION TO YOUR PATIENT ROOM.  SITTING IN THE RECLINER THROUGHOUT THE DAY IS IMPORTANT FOR DRINKING FLUIDS AND MOVING GAS THROUGHOUT THE GI TRACT.  COMPRESSION STOCKINGS SHOULD BE WORN [facility] STAY UNLESS YOU ARE WALKING.   INCENTIVE SPIROMETER SHOULD BE USED EVERY HOUR WHILE AWAKE TO DECREASE POST-OPERATIVE COMPLICATIONS SUCH AS PNEUMONIA.  WHEN DISCHARGED HOME, IT IS IMPORTANT TO CONTINUE TO WALK EVERY HOUR AND USE THE INCENTIVE SPIROMETER EVERY HOUR.   CLEAR LIQUID DIET  Foods Allowed                                                                     Foods Excluded  Coffee and tea, regular and decaf                             liquids that you cannot  Plain Jell-O any favor except red or purple                                           see through such as: Fruit ices (not with fruit pulp)                                     milk, soups, orange juice  Iced Popsicles                                     All solid food Carbonated beverages, regular and diet                                    Cranberry, grape and apple juices Sports drinks like Gatorade Lightly seasoned clear  broth or consume(fat free) Sugar, honey syrup  Sample Menu Breakfast                                Lunch                                     Supper Cranberry juice                    Beef broth                            Chicken broth Jell-O                                     Grape juice                           Apple juice Coffee or tea                        Jell-O                                      Popsicle                                                Coffee or tea                        Coffee or tea  _____________________________________________________________________   BRUSH YOUR TEETH MORNING OF SURGERY AND RINSE YOUR MOUTH OUT, NO CHEWING GUM CANDY OR MINTS.    Take these medicines the morning of surgery with A SIP OF WATER: N/A. Use inhalers as usual.                               You may not have any metal on your body including hair pins and              piercings  Do not wear jewelry, make-up, lotions, powders or perfumes, deodorant             Do not wear nail polish on your fingernails.  Do not shave  48 hours prior to surgery.    Do not bring valuables to the hospital. [facility] IS NOT             RESPONSIBLE   FOR VALUABLES.  Contacts, dentures or bridgework may not be worn into surgery.  Leave suitcase in the car. After surgery it may be brought to your room.     Patients discharged the day of surgery will not be allowed to drive home. IF YOU ARE HAVING SURGERY AND GOING HOME THE SAME DAY, YOU MUST HAVE AN ADULT TO DRIVE YOU HOME AND BE WITH YOU FOR 24 HOURS. YOU MAY GO HOME BY TAXI OR UBER OR ORTHERWISE, BUT AN ADULT MUST ACCOMPANY YOU HOME AND STAY WITH  YOU FOR 24 HOURS.  Name and phone number of your driver:  Special Instructions: N/A              Please read over  the following fact sheets you were given: _____________________________________________________________________           [facility] - Preparing for Surgery Before surgery, you can play an important role.  Because skin is not sterile, your skin needs to be as free of germs as possible.  You can reduce the number of germs on your skin by washing with CHG (chlorahexidine gluconate) soap before surgery.  CHG is an antiseptic cleaner which kills germs and bonds with the skin to continue killing germs even after washing. Please DO NOT use if you have an allergy to CHG or antibacterial soaps.  If your skin becomes reddened/irritated stop using the CHG and inform your nurse when you arrive at Short Stay. Do not shave (including legs and underarms) for at least 48 hours prior to the first CHG shower.  You may shave your face/neck. Please follow these instructions carefully:  1.  Shower with CHG Soap the night before surgery and the  morning of Surgery.  2.  If you choose to wash your hair, wash your hair first as usual with your  normal  shampoo.  3.  After you shampoo, rinse your hair and body thoroughly to remove the  shampoo.                           4.  Use CHG as you would any other liquid soap.  You can apply chg directly  to the skin and wash                       Gently with a scrungie or clean washcloth.  5.  Apply the CHG Soap to your body ONLY FROM THE NECK DOWN.   Do not use on face/ open                           Wound or open sores. Avoid contact with eyes, ears mouth and genitals (private parts).                       Wash face,  Genitals (private parts) with your normal soap.             6.  Wash thoroughly, paying special attention to the area where your surgery  will be performed.  7.  Thoroughly rinse your body with warm water from the neck down.  8.  DO NOT shower/wash with your normal soap after using and rinsing off  the CHG Soap.                9.  Pat yourself dry with a clean  towel.            10.  Wear clean pajamas.            11.  Place clean sheets on your bed the night of your first shower and do not  sleep with pets. Day of Surgery : Do not apply any lotions/deodorants the morning of surgery.  Please wear clean clothes to the hospital/surgery center.  FAILURE TO FOLLOW THESE INSTRUCTIONS MAY RESULT IN THE CANCELLATION OF YOUR SURGERY PATIENT SIGNATURE_________________________________  NURSE SIGNATURE__________________________________  ________________________________________________________________________

## 2021-03-15 ENCOUNTER — Other Ambulatory Visit: Payer: Self-pay

## 2021-03-15 ENCOUNTER — Encounter (HOSPITAL_COMMUNITY)
Admission: RE | Admit: 2021-03-15 | Discharge: 2021-03-15 | Disposition: A | Discharge status: Home / Self Care | Payer: 59 | Source: Ambulatory Visit | Attending: Obstetrics & Gynecology | Admitting: Obstetrics & Gynecology

## 2021-03-15 ENCOUNTER — Encounter (HOSPITAL_COMMUNITY): Payer: Self-pay

## 2021-03-15 DIAGNOSIS — Z01812 Encounter for preprocedural laboratory examination: Secondary | ICD-10-CM | POA: Insufficient documentation

## 2021-03-15 HISTORY — DX: Other specified postprocedural states: Z98.890

## 2021-03-15 HISTORY — DX: Other specified postprocedural states: R11.2

## 2021-03-15 LAB — CBC
HCT: 39.9 % (ref 36.0–46.0)
Hemoglobin: 13.1 g/dL (ref 12.0–15.0)
MCH: 28.9 pg (ref 26.0–34.0)
MCHC: 32.8 g/dL (ref 30.0–36.0)
MCV: 87.9 fL (ref 80.0–100.0)
Platelets: 247 10*3/uL (ref 150–400)
RBC: 4.54 MIL/uL (ref 3.87–5.11)
RDW: 14.1 % (ref 11.5–15.5)
WBC: 7 10*3/uL (ref 4.0–10.5)
nRBC: 0 % (ref 0.0–0.2)

## 2021-03-15 LAB — BASIC METABOLIC PANEL
Anion gap: 11 (ref 5–15)
BUN: 17 mg/dL (ref 6–20)
CO2: 25 mmol/L (ref 22–32)
Calcium: 9.5 mg/dL (ref 8.9–10.3)
Chloride: 100 mmol/L (ref 98–111)
Creatinine, Ser: 0.63 mg/dL (ref 0.44–1.00)
GFR, Estimated: >60 mL/min (ref >60)
Glucose, Bld: 88 mg/dL (ref 70–99)
Potassium: 4.2 mmol/L (ref 3.5–5.1)
Sodium: 136 mmol/L (ref 135–145)

## 2021-03-15 NOTE — Progress Notes (Addendum)
COVID Vaccine Completed: Yes Date COVID Vaccine completed: 08/02/20 [place] COVID vaccine manufacturer:  [employer]   PCP - Burke Keels: NP Cardiologist -   Chest x-ray - 11/25/20 EKG - 11/24/20 EPIC Stress Test -  ECHO -  Cardiac Cath -  Pacemaker/ICD device last checked:  Sleep Study -  CPAP -   Fasting Blood Sugar -  Checks Blood Sugar _____ times a day  Blood Thinner Instructions: Aspirin Instructions: Last Dose:  Anesthesia review: COVID positive on 02/16/21  Patient denies shortness of breath, fever, cough and chest pain at PAT appointment   Patient verbalized understanding of instructions that were given to them at the PAT appointment. Patient was also instructed that they will need to review over the PAT instructions again at home before surgery.

## 2021-03-21 ENCOUNTER — Other Ambulatory Visit: Payer: Self-pay

## 2021-03-21 ENCOUNTER — Inpatient Hospital Stay (HOSPITAL_COMMUNITY): Payer: 59 | Admitting: Anesthesiology

## 2021-03-21 ENCOUNTER — Inpatient Hospital Stay (HOSPITAL_COMMUNITY)
Admission: RE | Admit: 2021-03-21 | Discharge: 2021-03-22 | DRG: 621 | Disposition: A | Discharge status: Home / Self Care | Payer: 59 | Attending: General Surgery | Admitting: General Surgery

## 2021-03-21 ENCOUNTER — Encounter (HOSPITAL_COMMUNITY)
Admission: RE | Disposition: A | Discharge status: Home / Self Care | Payer: Self-pay | Source: Home / Self Care | Attending: General Surgery

## 2021-03-21 ENCOUNTER — Encounter (HOSPITAL_COMMUNITY): Payer: Self-pay | Admitting: General Surgery

## 2021-03-21 DIAGNOSIS — Z79899 Other long term (current) drug therapy: Secondary | ICD-10-CM | POA: Diagnosis not present

## 2021-03-21 DIAGNOSIS — Z87891 Personal history of nicotine dependence: Secondary | ICD-10-CM | POA: Diagnosis not present

## 2021-03-21 DIAGNOSIS — F419 Anxiety disorder, unspecified: Secondary | ICD-10-CM | POA: Diagnosis present

## 2021-03-21 DIAGNOSIS — Z7951 Long term (current) use of inhaled steroids: Secondary | ICD-10-CM | POA: Diagnosis not present

## 2021-03-21 DIAGNOSIS — Z91018 Allergy to other foods: Secondary | ICD-10-CM | POA: Diagnosis not present

## 2021-03-21 DIAGNOSIS — Z79890 Hormone replacement therapy (postmenopausal): Secondary | ICD-10-CM | POA: Diagnosis not present

## 2021-03-21 DIAGNOSIS — F32A Depression, unspecified: Secondary | ICD-10-CM | POA: Diagnosis present

## 2021-03-21 DIAGNOSIS — Z825 Family history of asthma and other chronic lower respiratory diseases: Secondary | ICD-10-CM

## 2021-03-21 DIAGNOSIS — E039 Hypothyroidism, unspecified: Secondary | ICD-10-CM | POA: Diagnosis present

## 2021-03-21 DIAGNOSIS — Z888 Allergy status to other drugs, medicaments and biological substances status: Secondary | ICD-10-CM

## 2021-03-21 DIAGNOSIS — J45909 Unspecified asthma, uncomplicated: Secondary | ICD-10-CM | POA: Diagnosis present

## 2021-03-21 DIAGNOSIS — Z6839 Body mass index (BMI) 39.0-39.9, adult: Secondary | ICD-10-CM | POA: Diagnosis not present

## 2021-03-21 DIAGNOSIS — I10 Essential (primary) hypertension: Secondary | ICD-10-CM | POA: Diagnosis present

## 2021-03-21 HISTORY — PX: GASTRIC ROUX-EN-Y: SHX5262

## 2021-03-21 LAB — COMPREHENSIVE METABOLIC PANEL
ALT: 16 U/L (ref 0–44)
AST: 20 U/L (ref 15–41)
Albumin: 4.2 g/dL (ref 3.5–5.0)
Alkaline Phosphatase: 38 U/L (ref 38–126)
Anion gap: 12 (ref 5–15)
BUN: 11 mg/dL (ref 6–20)
CO2: 25 mmol/L (ref 22–32)
Calcium: 8.7 mg/dL — ABNORMAL LOW (ref 8.9–10.3)
Chloride: 101 mmol/L (ref 98–111)
Creatinine, Ser: 0.74 mg/dL (ref 0.44–1.00)
GFR, Estimated: >60 mL/min (ref >60)
Glucose, Bld: 129 mg/dL — ABNORMAL HIGH (ref 70–99)
Potassium: 3.8 mmol/L (ref 3.5–5.1)
Sodium: 138 mmol/L (ref 135–145)
Total Bilirubin: 0.6 mg/dL (ref 0.3–1.2)
Total Protein: 6.9 g/dL (ref 6.5–8.1)

## 2021-03-21 LAB — CBC WITH DIFFERENTIAL/PLATELET
Abs Immature Granulocytes: 0.03 10*3/uL (ref 0.00–0.07)
Basophils Absolute: 0 10*3/uL (ref 0.0–0.1)
Basophils Relative: 1 %
Eosinophils Absolute: 0 10*3/uL (ref 0.0–0.5)
Eosinophils Relative: 1 %
HCT: 37 % (ref 36.0–46.0)
Hemoglobin: 12.3 g/dL (ref 12.0–15.0)
Immature Granulocytes: 0 %
Lymphocytes Relative: 11 %
Lymphs Abs: 0.8 10*3/uL (ref 0.7–4.0)
MCH: 29.4 pg (ref 26.0–34.0)
MCHC: 33.2 g/dL (ref 30.0–36.0)
MCV: 88.3 fL (ref 80.0–100.0)
Monocytes Absolute: 0.2 10*3/uL (ref 0.1–1.0)
Monocytes Relative: 2 %
Neutro Abs: 6.7 10*3/uL (ref 1.7–7.7)
Neutrophils Relative %: 85 %
Platelets: 225 10*3/uL (ref 150–400)
RBC: 4.19 MIL/uL (ref 3.87–5.11)
RDW: 14.2 % (ref 11.5–15.5)
WBC: 7.8 10*3/uL (ref 4.0–10.5)
nRBC: 0 % (ref 0.0–0.2)

## 2021-03-21 LAB — TYPE AND SCREEN
ABO/RH(D): A POS
Antibody Screen: NEGATIVE

## 2021-03-21 LAB — PREGNANCY, URINE: Preg Test, Ur: NEGATIVE

## 2021-03-21 SURGERY — LAPAROSCOPIC ROUX-EN-Y GASTRIC BYPASS WITH UPPER ENDOSCOPY
Anesthesia: General | Site: Abdomen

## 2021-03-21 MED ORDER — ACETAMINOPHEN 160 MG/5ML PO SOLN: 1000.0000 mg | Freq: Three times a day (TID) | ORAL | Status: DC

## 2021-03-21 MED ORDER — MORPHINE SULFATE (PF) 2 MG/ML IV SOLN
1.0000 mg | INTRAVENOUS | Status: DC | PRN
  Administered 2021-03-21 (×2): 2 mg via INTRAVENOUS
  Filled 2021-03-21 (×2): qty 1

## 2021-03-21 MED ORDER — MIDAZOLAM HCL 2 MG/2ML IJ SOLN
INTRAMUSCULAR | Status: AC
  Filled 2021-03-21: qty 2

## 2021-03-21 MED ORDER — PROMETHAZINE HCL 25 MG/ML IJ SOLN
INTRAMUSCULAR | Status: AC
  Filled 2021-03-21: qty 1

## 2021-03-21 MED ORDER — MOMETASONE FURO-FORMOTEROL FUM 200-5 MCG/ACT IN AERO
2.0000 | INHALATION_SPRAY | Freq: Two times a day (BID) | RESPIRATORY_TRACT | Status: DC
  Administered 2021-03-21 – 2021-03-22 (×2): 2 via RESPIRATORY_TRACT
  Filled 2021-03-21: qty 8.8

## 2021-03-21 MED ORDER — BUPIVACAINE-EPINEPHRINE (PF) 0.25% -1:200000 IJ SOLN
INTRAMUSCULAR | Status: AC
  Filled 2021-03-21: qty 30

## 2021-03-21 MED ORDER — OXYCODONE HCL 5 MG PO TABS: 5.0000 mg | ORAL_TABLET | Freq: Once | ORAL | Status: DC | PRN

## 2021-03-21 MED ORDER — FENTANYL CITRATE (PF) 250 MCG/5ML IJ SOLN
INTRAMUSCULAR | Status: AC
  Filled 2021-03-21: qty 5

## 2021-03-21 MED ORDER — DEXTROSE-NACL 5-0.45 % IV SOLN: INTRAVENOUS | Status: DC

## 2021-03-21 MED ORDER — FENTANYL CITRATE (PF) 250 MCG/5ML IJ SOLN
INTRAMUSCULAR | Status: DC | PRN
  Administered 2021-03-21 (×2): 100 ug via INTRAVENOUS
  Administered 2021-03-21: 50 ug via INTRAVENOUS

## 2021-03-21 MED ORDER — CHLORHEXIDINE GLUCONATE CLOTH 2 % EX PADS: 6.0000 | MEDICATED_PAD | Freq: Once | CUTANEOUS | Status: DC

## 2021-03-21 MED ORDER — DEXAMETHASONE SODIUM PHOSPHATE 4 MG/ML IJ SOLN: 4.0000 mg | INTRAMUSCULAR | Status: DC

## 2021-03-21 MED ORDER — DEXAMETHASONE SODIUM PHOSPHATE 10 MG/ML IJ SOLN
INTRAMUSCULAR | Status: AC
  Filled 2021-03-21: qty 1

## 2021-03-21 MED ORDER — KETAMINE HCL 10 MG/ML IJ SOLN
INTRAMUSCULAR | Status: AC
  Filled 2021-03-21: qty 1

## 2021-03-21 MED ORDER — ROCURONIUM BROMIDE 10 MG/ML (PF) SYRINGE
PREFILLED_SYRINGE | INTRAVENOUS | Status: AC
  Filled 2021-03-21: qty 10

## 2021-03-21 MED ORDER — SUGAMMADEX SODIUM 500 MG/5ML IV SOLN
INTRAVENOUS | Status: DC | PRN
  Administered 2021-03-21: 300 mg via INTRAVENOUS

## 2021-03-21 MED ORDER — ORAL CARE MOUTH RINSE: 15.0000 mL | Freq: Once | OROMUCOSAL | Status: DC

## 2021-03-21 MED ORDER — SCOPOLAMINE 1 MG/3DAYS TD PT72: 1.0000 | MEDICATED_PATCH | TRANSDERMAL | Status: DC

## 2021-03-21 MED ORDER — PROPOFOL 1000 MG/100ML IV EMUL
INTRAVENOUS | Status: AC
  Filled 2021-03-21: qty 100

## 2021-03-21 MED ORDER — SODIUM CHLORIDE 0.9 % IV SOLN
2.0000 g | Freq: Once | INTRAVENOUS | Status: AC
  Administered 2021-03-21: 2 g via INTRAVENOUS
  Filled 2021-03-21: qty 2

## 2021-03-21 MED ORDER — SUGAMMADEX SODIUM 500 MG/5ML IV SOLN
INTRAVENOUS | Status: AC
  Filled 2021-03-21: qty 5

## 2021-03-21 MED ORDER — APREPITANT 40 MG PO CAPS
40.0000 mg | ORAL_CAPSULE | ORAL | Status: AC
  Administered 2021-03-21: 40 mg via ORAL
  Filled 2021-03-21: qty 1

## 2021-03-21 MED ORDER — LACTATED RINGERS IR SOLN
Status: DC | PRN
  Administered 2021-03-21: 1000 mL

## 2021-03-21 MED ORDER — OXYCODONE HCL 5 MG/5ML PO SOLN: 5.0000 mg | Freq: Once | ORAL | Status: DC | PRN

## 2021-03-21 MED ORDER — SODIUM CHLORIDE 0.9 % IV SOLN: 2.0000 g | INTRAVENOUS | Status: DC

## 2021-03-21 MED ORDER — FENTANYL CITRATE (PF) 100 MCG/2ML IJ SOLN
INTRAMUSCULAR | Status: AC
  Filled 2021-03-21: qty 2

## 2021-03-21 MED ORDER — ACETAMINOPHEN 500 MG PO TABS: 1000.0000 mg | ORAL_TABLET | ORAL | Status: DC

## 2021-03-21 MED ORDER — LEVOCETIRIZINE DIHYDROCHLORIDE 5 MG PO TABS: 5.0000 mg | ORAL_TABLET | Freq: Every evening | ORAL | Status: DC

## 2021-03-21 MED ORDER — PROMETHAZINE HCL 25 MG/ML IJ SOLN
6.2500 mg | INTRAMUSCULAR | Status: AC | PRN
  Administered 2021-03-21 (×2): 6.25 mg via INTRAVENOUS

## 2021-03-21 MED ORDER — BUPIVACAINE LIPOSOME 1.3 % IJ SUSP
20.0000 mL | Freq: Once | INTRAMUSCULAR | Status: DC
  Filled 2021-03-21: qty 20

## 2021-03-21 MED ORDER — FENTANYL CITRATE (PF) 100 MCG/2ML IJ SOLN
25.0000 ug | INTRAMUSCULAR | Status: DC | PRN
  Administered 2021-03-21: 25 ug via INTRAVENOUS

## 2021-03-21 MED ORDER — PROPOFOL 10 MG/ML IV BOLUS
INTRAVENOUS | Status: DC | PRN
  Administered 2021-03-21: 200 mg via INTRAVENOUS

## 2021-03-21 MED ORDER — PROPOFOL 500 MG/50ML IV EMUL
INTRAVENOUS | Status: DC | PRN
Start: 2021-03-21 — End: 2021-03-21
  Administered 2021-03-21: 25 ug/kg/min via INTRAVENOUS

## 2021-03-21 MED ORDER — LACTATED RINGERS IV SOLN: INTRAVENOUS | Status: DC

## 2021-03-21 MED ORDER — BUPIVACAINE LIPOSOME 1.3 % IJ SUSP
INTRAMUSCULAR | Status: DC | PRN
  Administered 2021-03-21: 20 mL

## 2021-03-21 MED ORDER — MIDAZOLAM HCL 2 MG/2ML IJ SOLN
INTRAMUSCULAR | Status: DC | PRN
  Administered 2021-03-21: 2 mg via INTRAVENOUS

## 2021-03-21 MED ORDER — KETAMINE HCL 10 MG/ML IJ SOLN
INTRAMUSCULAR | Status: DC | PRN
  Administered 2021-03-21: 30 mg via INTRAVENOUS

## 2021-03-21 MED ORDER — 0.9 % SODIUM CHLORIDE (POUR BTL) OPTIME
TOPICAL | Status: DC | PRN
  Administered 2021-03-21: 1000 mL

## 2021-03-21 MED ORDER — ACETAMINOPHEN 500 MG PO TABS
1000.0000 mg | ORAL_TABLET | Freq: Three times a day (TID) | ORAL | Status: DC
  Administered 2021-03-21 – 2021-03-22 (×3): 1000 mg via ORAL
  Filled 2021-03-21 (×4): qty 2

## 2021-03-21 MED ORDER — APREPITANT 40 MG PO CAPS: 40.0000 mg | ORAL_CAPSULE | ORAL | Status: DC

## 2021-03-21 MED ORDER — HEPARIN SODIUM (PORCINE) 5000 UNIT/ML IJ SOLN
5000.0000 [IU] | INTRAMUSCULAR | Status: AC
  Administered 2021-03-21: 5000 [IU] via SUBCUTANEOUS
  Filled 2021-03-21: qty 1

## 2021-03-21 MED ORDER — LIDOCAINE 2% (20 MG/ML) 5 ML SYRINGE
INTRAMUSCULAR | Status: DC | PRN
  Administered 2021-03-21: 100 mg via INTRAVENOUS

## 2021-03-21 MED ORDER — PROPOFOL 10 MG/ML IV BOLUS
INTRAVENOUS | Status: AC
  Filled 2021-03-21: qty 20

## 2021-03-21 MED ORDER — HYDRALAZINE HCL 20 MG/ML IJ SOLN
10.0000 mg | INTRAMUSCULAR | Status: DC | PRN
Start: 2021-03-21 — End: 2021-03-22

## 2021-03-21 MED ORDER — BUPIVACAINE HCL 0.25 % IJ SOLN
INTRAMUSCULAR | Status: DC | PRN
  Administered 2021-03-21: 30 mL

## 2021-03-21 MED ORDER — FAMOTIDINE IN NACL 20-0.9 MG/50ML-% IV SOLN
20.0000 mg | Freq: Two times a day (BID) | INTRAVENOUS | Status: DC
  Administered 2021-03-21 – 2021-03-22 (×3): 20 mg via INTRAVENOUS
  Filled 2021-03-21 (×3): qty 50

## 2021-03-21 MED ORDER — SCOPOLAMINE 1 MG/3DAYS TD PT72
1.0000 | MEDICATED_PATCH | TRANSDERMAL | Status: DC
  Administered 2021-03-21: 1.5 mg via TRANSDERMAL
  Filled 2021-03-21: qty 1

## 2021-03-21 MED ORDER — DEXAMETHASONE SODIUM PHOSPHATE 4 MG/ML IJ SOLN
4.0000 mg | INTRAMUSCULAR | Status: AC
  Administered 2021-03-21: 4 mg via INTRAVENOUS

## 2021-03-21 MED ORDER — ONDANSETRON HCL 4 MG/2ML IJ SOLN
INTRAMUSCULAR | Status: DC | PRN
  Administered 2021-03-21: 4 mg via INTRAVENOUS

## 2021-03-21 MED ORDER — LIDOCAINE 2% (20 MG/ML) 5 ML SYRINGE
INTRAMUSCULAR | Status: AC
  Filled 2021-03-21: qty 5

## 2021-03-21 MED ORDER — ENOXAPARIN SODIUM 30 MG/0.3ML IJ SOSY
30.0000 mg | PREFILLED_SYRINGE | Freq: Two times a day (BID) | INTRAMUSCULAR | Status: DC
  Administered 2021-03-21 – 2021-03-22 (×2): 30 mg via SUBCUTANEOUS
  Filled 2021-03-21 (×2): qty 0.3

## 2021-03-21 MED ORDER — OXYCODONE HCL 5 MG/5ML PO SOLN: 5.0000 mg | Freq: Four times a day (QID) | ORAL | Status: DC | PRN

## 2021-03-21 MED ORDER — GABAPENTIN 300 MG PO CAPS: 300.0000 mg | ORAL_CAPSULE | ORAL | Status: DC

## 2021-03-21 MED ORDER — ONDANSETRON HCL 4 MG/2ML IJ SOLN: 4.0000 mg | INTRAMUSCULAR | Status: DC | PRN

## 2021-03-21 MED ORDER — CHLORHEXIDINE GLUCONATE 0.12 % MT SOLN: 15.0000 mL | Freq: Once | OROMUCOSAL | Status: DC

## 2021-03-21 MED ORDER — ACETAMINOPHEN 500 MG PO TABS
1000.0000 mg | ORAL_TABLET | Freq: Once | ORAL | Status: AC
  Administered 2021-03-21: 1000 mg via ORAL
  Filled 2021-03-21: qty 2

## 2021-03-21 MED ORDER — SIMETHICONE 80 MG PO CHEW: 80.0000 mg | CHEWABLE_TABLET | Freq: Four times a day (QID) | ORAL | Status: DC | PRN

## 2021-03-21 MED ORDER — ONDANSETRON HCL 4 MG/2ML IJ SOLN
INTRAMUSCULAR | Status: AC
  Filled 2021-03-21: qty 2

## 2021-03-21 MED ORDER — LORATADINE 10 MG PO TABS: 10.0000 mg | ORAL_TABLET | Freq: Every day | ORAL | Status: DC

## 2021-03-21 MED ORDER — ROCURONIUM BROMIDE 10 MG/ML (PF) SYRINGE
PREFILLED_SYRINGE | INTRAVENOUS | Status: DC | PRN
  Administered 2021-03-21: 30 mg via INTRAVENOUS
  Administered 2021-03-21: 70 mg via INTRAVENOUS
  Administered 2021-03-21: 20 mg via INTRAVENOUS

## 2021-03-21 MED ORDER — GABAPENTIN 300 MG PO CAPS
300.0000 mg | ORAL_CAPSULE | ORAL | Status: AC
  Administered 2021-03-21: 300 mg via ORAL
  Filled 2021-03-21: qty 1

## 2021-03-21 MED ORDER — ENSURE MAX PROTEIN PO LIQD
2.0000 [oz_av] | ORAL | Status: DC
  Administered 2021-03-22 (×5): 2 [oz_av] via ORAL

## 2021-03-21 MED ORDER — HEPARIN SODIUM (PORCINE) 5000 UNIT/ML IJ SOLN: 5000.0000 [IU] | INTRAMUSCULAR | Status: DC

## 2021-03-21 SURGICAL SUPPLY — 71 items
APL PRP STRL LF DISP 70% ISPRP (MISCELLANEOUS) ×1
APL SKNCLS STERI-STRIP NONHPOA (GAUZE/BANDAGES/DRESSINGS)
APPLIER CLIP 5 13 M/L LIGAMAX5 (MISCELLANEOUS)
APPLIER CLIP ROT 10 11.4 M/L (STAPLE)
APPLIER CLIP ROT 13.4 12 LRG (CLIP)
APR CLP LRG 13.4X12 ROT 20 MLT (CLIP)
APR CLP MED LRG 11.4X10 (STAPLE)
APR CLP MED LRG 5 ANG JAW (MISCELLANEOUS)
BENZOIN TINCTURE PRP APPL 2/3 (GAUZE/BANDAGES/DRESSINGS) IMPLANT
BLADE SURG SZ11 CARB STEEL (BLADE) ×2 IMPLANT
BNDG ADH 1X3 SHEER STRL LF (GAUZE/BANDAGES/DRESSINGS) IMPLANT
BNDG ADH THN 3X1 STRL LF (GAUZE/BANDAGES/DRESSINGS)
CABLE HIGH FREQUENCY MONO STRZ (ELECTRODE) IMPLANT
CHLORAPREP W/TINT 26 (MISCELLANEOUS) ×2 IMPLANT
CLIP APPLIE 5 13 M/L LIGAMAX5 (MISCELLANEOUS) IMPLANT
CLIP APPLIE ROT 10 11.4 M/L (STAPLE) IMPLANT
CLIP APPLIE ROT 13.4 12 LRG (CLIP) IMPLANT
COVER SURGICAL LIGHT HANDLE (MISCELLANEOUS) ×2 IMPLANT
DEVICE SUTURE ENDOST 10MM (ENDOMECHANICALS) ×2 IMPLANT
DRAIN CHANNEL 19F RND (DRAIN) IMPLANT
DRAIN PENROSE 0.25X18 (DRAIN) ×2 IMPLANT
ELECT L-HOOK LAP 45CM DISP (ELECTROSURGICAL) ×2
ELECTRODE L-HOOK LAP 45CM DISP (ELECTROSURGICAL) ×1 IMPLANT
EVACUATOR SILICONE 100CC (DRAIN) IMPLANT
GAUZE 4X4 16PLY ~~LOC~~+RFID DBL (SPONGE) IMPLANT
GLOVE SURG POLYISO LF SZ7 (GLOVE) ×2 IMPLANT
GLOVE SURG UNDER POLY LF SZ7 (GLOVE) ×2 IMPLANT
GOWN STRL REUS W/TWL LRG LVL3 (GOWN DISPOSABLE) ×2 IMPLANT
GOWN STRL REUS W/TWL XL LVL3 (GOWN DISPOSABLE) ×6 IMPLANT
GRASPER SUT TROCAR 14GX15 (MISCELLANEOUS) IMPLANT
HANDLE STAPLE EGIA 4 XL (STAPLE) ×2 IMPLANT
KIT BASIN OR (CUSTOM PROCEDURE TRAY) ×2 IMPLANT
KIT GASTRIC LAVAGE 34FR ADT (SET/KITS/TRAYS/PACK) IMPLANT
KIT TURNOVER KIT A (KITS) ×2 IMPLANT
MARKER SKIN DUAL TIP RULER LAB (MISCELLANEOUS) ×2 IMPLANT
MAT PREVALON FULL STRYKER (MISCELLANEOUS) ×2 IMPLANT
NDL SPNL 22GX3.5 QUINCKE BK (NEEDLE) ×1 IMPLANT
NEEDLE SPNL 22GX3.5 QUINCKE BK (NEEDLE) ×2 IMPLANT
PACK CARDIOVASCULAR III (CUSTOM PROCEDURE TRAY) ×2 IMPLANT
PENCIL SMOKE EVACUATOR (MISCELLANEOUS) IMPLANT
RELOAD EGIA 45 MED/THCK PURPLE (STAPLE) ×2 IMPLANT
RELOAD EGIA 45 TAN VASC (STAPLE) IMPLANT
RELOAD EGIA 60 MED/THCK PURPLE (STAPLE) ×8 IMPLANT
RELOAD EGIA 60 TAN VASC (STAPLE) ×6 IMPLANT
RELOAD ENDO STITCH 2.0 (ENDOMECHANICALS) ×26
RELOAD SUT SNGL STCH ABSRB 2-0 (ENDOMECHANICALS) ×5 IMPLANT
RELOAD SUT SNGL STCH BLK 2-0 (ENDOMECHANICALS) ×6 IMPLANT
SCISSORS LAP 5X45 EPIX DISP (ENDOMECHANICALS) ×2 IMPLANT
SET IRRIG TUBING LAPAROSCOPIC (IRRIGATION / IRRIGATOR) ×2 IMPLANT
SET TUBE SMOKE EVAC HIGH FLOW (TUBING) ×2 IMPLANT
SHEARS HARMONIC ACE PLUS 45CM (MISCELLANEOUS) ×2 IMPLANT
SLEEVE XCEL OPT CAN 5 100 (ENDOMECHANICALS) ×6 IMPLANT
SOL ANTI FOG 6CC (MISCELLANEOUS) ×1 IMPLANT
SOLUTION ANTI FOG 6CC (MISCELLANEOUS) ×1
STAPLER VISISTAT 35W (STAPLE) IMPLANT
STRIP CLOSURE SKIN 1/2X4 (GAUZE/BANDAGES/DRESSINGS) IMPLANT
SUT ETHILON 2 0 PS N (SUTURE) IMPLANT
SUT MNCRL AB 4-0 PS2 18 (SUTURE) ×2 IMPLANT
SUT RELOAD ENDO STITCH 2 48X1 (ENDOMECHANICALS) ×6
SUT RELOAD ENDO STITCH 2.0 (ENDOMECHANICALS) ×7
SUT SILK 0 SH 30 (SUTURE) IMPLANT
SUT VICRYL 0 TIES 12 18 (SUTURE) IMPLANT
SUTURE RELOAD END STTCH 2 48X1 (ENDOMECHANICALS) ×6 IMPLANT
SUTURE RELOAD ENDO STITCH 2.0 (ENDOMECHANICALS) ×7 IMPLANT
SYR 20ML LL LF (SYRINGE) ×2 IMPLANT
SYR 50ML LL SCALE MARK (SYRINGE) ×2 IMPLANT
TOWEL OR 17X26 10 PK STRL BLUE (TOWEL DISPOSABLE) ×2 IMPLANT
TOWEL OR NON WOVEN STRL DISP B (DISPOSABLE) ×2 IMPLANT
TROCAR BLADELESS OPT 5 100 (ENDOMECHANICALS) ×2 IMPLANT
TROCAR XCEL 12X100 BLDLESS (ENDOMECHANICALS) ×2 IMPLANT
TUBING CONNECTING 10 (TUBING) ×2 IMPLANT

## 2021-03-21 NOTE — Progress Notes (Signed)

## 2021-03-21 NOTE — Progress Notes (Signed)
PHARMACY CONSULT FOR:  Risk Assessment for Post-Discharge VTE Following Bariatric Surgery  Post-Discharge VTE Risk Assessment: This patient's probability of 30-day post-discharge VTE is increased due to the factors marked:   Female    Age >/=60 years    BMI >/=50 kg/m2    CHF    Dyspnea at Rest    Paraplegia  X  Non-gastric-band surgery    Operation Time >/=3 hr    Return to OR     Length of Stay >/= 3 d   Hx of VTE   Hypercoagulable condition   Significant venous stasis    Predicted probability of 30-day post-discharge VTE: 0.16% (mild)  Other patient-specific factors to consider:  Recommendation for Discharge: No pharmacologic prophylaxis post-discharge  Claudia Price is a 30 y.o. 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  Recent Labs    03/21/21 1242  WBC 7.8  HGB 12.3  HCT 37.0  PLT 225  CREATININE 0.74  ALBUMIN 4.2  PROT 6.9  AST 20  ALT 16  ALKPHOS 38  BILITOT 0.6   Estimated Creatinine Clearance: 141.9 mL/min (by C-G formula based on SCr of 0.74 mg/dL).    Past Medical History:  Diagnosis Date   Anemia    borderline   Anxiety    Asthma    Complication of anesthesia    spinal block causes NV   Depression    postpartum   Hypothyroidism    Pneumonia    PONV (postoperative nausea and vomiting)    Thyroid disease      Medications Prior to Admission  Medication Sig Dispense Refill Last Dose   albuterol (VENTOLIN HFA) 108 (90 Base)  MCG/ACT inhaler INHALE 2 PUFFS BY MOUTH EVERY 4 HOURS AS NEEDED (Patient taking differently: Inhale 2 puffs into the lungs every 4 (four) hours as needed for wheezing or shortness of breath.) 8.5 each 1 Past Week   budesonide-formoterol (SYMBICORT) 160-4.5 MCG/ACT inhaler INHALE 2 PUFFS TWO TIMES DAILY WITH SPACER (Patient taking differently: Inhale 2 puffs into the lungs in the morning and at bedtime.) 10.2 each 0 Past Week   calcium citrate-vitamin D (CALCIUM CITRATE CHEWY BITE) 500-500 MG-UNIT chewable tablet Chew 1 tablet by mouth 3 (three) times daily.   Past Week   fluticasone (FLONASE) 50 MCG/ACT nasal spray SPRAY 1-2 SPRAYS INTO BOTH NOSTRILS DAILY. (Patient taking differently: Place 1-2 sprays into both nostrils daily as needed for allergies.) 16 mL 3 Past Week   levocetirizine (XYZAL) 5 MG tablet TAKE 1 TABLET BY MOUTH EVERY DAY IN THE EVENING (Patient taking differently: Take 5 mg by mouth at bedtime.) 30 tablet 9 Past Week   levothyroxine (SYNTHROID) 175 MCG tablet Take 175 mcg by mouth at bedtime.   Past Week   Multiple Vitamins-Minerals (CELEBRATE MULTI-COMPLETE 45) CHEW Chew 1 tablet by mouth in the morning.   Past Week   PARAGARD INTRAUTERINE COPPER IUD IUD 1 each by  Intrauterine route once.   03/21/2021   ibuprofen (ADVIL,MOTRIN) 800 MG tablet Take 1 tablet (800 mg total) by mouth 3 (three) times daily. (Patient not taking: Reported on 02/07/2021) 21 tablet 0 Not Taking at Unknown time    Rexford Maus, PharmD 03/21/2021 1:48 PM

## 2021-03-21 NOTE — Anesthesia Preprocedure Evaluation (Addendum)
Anesthesia Evaluation  Patient identified by MRN, date of birth, ID band Patient awake    Reviewed: Allergy & Precautions, NPO status , Patient's Chart, lab work & pertinent test results  History of Anesthesia Complications (+) PONV and history of anesthetic complications  Airway Mallampati: II  TM Distance: >3 FB Neck ROM: Full    Dental no notable dental hx.    Pulmonary asthma , former smoker,    Pulmonary exam normal        Cardiovascular negative cardio ROS Normal cardiovascular exam     Neuro/Psych Anxiety Depression negative neurological ROS     GI/Hepatic negative GI ROS, Neg liver ROS,   Endo/Other  Hypothyroidism Morbid obesity (BMI 40)  Renal/GU negative Renal ROS  negative genitourinary   Musculoskeletal negative musculoskeletal ROS (+)   Abdominal   Peds  Hematology negative hematology ROS (+)   Anesthesia Other Findings Day of surgery medications reviewed with patient.  Reproductive/Obstetrics negative OB ROS                            Anesthesia Physical Anesthesia Plan  ASA: 3  Anesthesia Plan: General   Post-op Pain Management:    Induction: Intravenous  PONV Risk Score and Plan: 4 or greater and Treatment may vary due to age or medical condition, Ondansetron, Dexamethasone, Midazolam, Scopolamine patch - Pre-op, Propofol infusion and Aprepitant  Airway Management Planned: Oral ETT  Additional Equipment: None  Intra-op Plan:   Post-operative Plan: Extubation in OR  Informed Consent: I have reviewed the patients History and Physical, chart, labs and discussed the procedure including the risks, benefits and alternatives for the proposed anesthesia with the patient or authorized representative who has indicated his/her understanding and acceptance.     Dental advisory given  Plan Discussed with: CRNA  Anesthesia Plan Comments:        Anesthesia  Quick Evaluation

## 2021-03-21 NOTE — Anesthesia Procedure Notes (Deleted)
Procedure Name: Intubation Date/Time: 03/21/2021 10:35 AM Performed by: Lollie Sails, CRNA Pre-anesthesia Checklist: Patient identified, Emergency Drugs available, Suction available, Patient being monitored and Timeout performed Patient Re-evaluated:Patient Re-evaluated prior to induction Oxygen Delivery Method: Circle system utilized Preoxygenation: Pre-oxygenation with 100% oxygen Induction Type: IV induction Ventilation: Mask ventilation without difficulty Laryngoscope Size: 3 and Mac Grade View: Grade I Tube type: Oral Tube size: 7.5 mm Number of attempts: 1 Airway Equipment and Method: Stylet Placement Confirmation: ETT inserted through vocal cords under direct vision, positive ETCO2 and breath sounds checked- equal and bilateral Secured at: 23 cm Tube secured with: Tape Dental Injury: Teeth and Oropharynx as per pre-operative assessment  Comments: Intubation by [employer] paramedic student.

## 2021-03-21 NOTE — H&P (Signed)
Claudia Price is an 30 y.o. female.   Chief Complaint: obesity HPI: 30 yo female with long history of obesity. She presents for bariatric surgery.  Past Medical History:  Diagnosis Date   Anemia    borderline   Anxiety    Asthma    Complication of anesthesia    spinal block causes NV   Depression    postpartum   Hypothyroidism    Pneumonia    PONV (postoperative nausea and vomiting)    Thyroid disease     Past Surgical History:  Procedure Laterality Date   ANKLE FRACTURE SURGERY     CERVICAL CERCLAGE     CERVICAL CERCLAGE N/A 07/08/2014   Procedure: MODIFIED MCDONALD CERCLAGE CERVICAL;  Surgeon: Loney Laurence, MD;  Location: [facility];  Service: Gynecology;  Laterality: N/A;   CESAREAN SECTION      Family History  Problem Relation Age of Onset   Asthma Sister    Social History:  reports that she quit smoking about 10 years ago. Her smoking use included cigarettes. She has never used smokeless tobacco. She reports that she does not drink alcohol and does not use drugs.  Allergies:  Allergies  Allergen Reactions   Cat Hair Extract Cough, Itching and Shortness Of Breath   Dust Mite Extract Cough, Itching and Shortness Of Breath   Molds & Smuts Cough, Itching, Shortness Of Breath and Swelling   Casein Other (See Comments) and Swelling   Dairy Aid [Lactase]    Gluten Meal    Soy Allergy     Medications Prior to Admission  Medication Sig Dispense Refill   albuterol (VENTOLIN HFA) 108 (90 Base) MCG/ACT inhaler INHALE 2 PUFFS BY MOUTH EVERY 4 HOURS AS NEEDED (Patient taking differently: Inhale 2 puffs into the lungs every 4 (four) hours as needed for wheezing or shortness of breath.) 8.5 each 1   budesonide-formoterol (SYMBICORT) 160-4.5 MCG/ACT inhaler INHALE 2 PUFFS TWO TIMES DAILY WITH SPACER (Patient taking differently: Inhale 2 puffs into the lungs in the morning and at bedtime.) 10.2 each 0   calcium citrate-vitamin D (CALCIUM CITRATE CHEWY BITE) 500-500 MG-UNIT  chewable tablet Chew 1 tablet by mouth 3 (three) times daily.     fluticasone (FLONASE) 50 MCG/ACT nasal spray SPRAY 1-2 SPRAYS INTO BOTH NOSTRILS DAILY. (Patient taking differently: Place 1-2 sprays into both nostrils daily as needed for allergies.) 16 mL 3   levocetirizine (XYZAL) 5 MG tablet TAKE 1 TABLET BY MOUTH EVERY DAY IN THE EVENING (Patient taking differently: Take 5 mg by mouth at bedtime.) 30 tablet 9   levothyroxine (SYNTHROID) 175 MCG tablet Take 175 mcg by mouth at bedtime.     Multiple Vitamins-Minerals (CELEBRATE MULTI-COMPLETE 45) CHEW Chew 1 tablet by mouth in the morning.     PARAGARD INTRAUTERINE COPPER IUD IUD 1 each by Intrauterine route once.     ibuprofen (ADVIL,MOTRIN) 800 MG tablet Take 1 tablet (800 mg total) by mouth 3 (three) times daily. (Patient not taking: Reported on 02/07/2021) 21 tablet 0    No results found for this or any previous visit (from the past 48 hour(s)). No results found.  Review of Systems  Constitutional:  Negative for chills and fever.  HENT:  Negative for hearing loss.   Respiratory:  Negative for cough.   Cardiovascular:  Negative for chest pain and palpitations.  Gastrointestinal:  Negative for abdominal pain, nausea and vomiting.  Genitourinary:  Negative for dysuria and urgency.  Musculoskeletal:  Negative for myalgias and  neck pain.  Skin:  Negative for rash.  Neurological:  Negative for dizziness and headaches.  Hematological:  Does not bruise/bleed easily.  Psychiatric/Behavioral:  Negative for suicidal ideas.    Blood pressure 124/78, pulse 87, temperature 98.3 F (36.8 C), temperature source Oral, resp. rate 18, height 5\' 9"  (1.753 m), weight 121.2 kg, SpO2 100 %, unknown if currently breastfeeding. Physical Exam Vitals reviewed.  Constitutional:      Appearance: She is well-developed.  HENT:     Head: Normocephalic and atraumatic.  Eyes:     Conjunctiva/sclera: Conjunctivae normal.     Pupils: Pupils are equal, round, and  reactive to light.  Cardiovascular:     Rate and Rhythm: Normal rate and regular rhythm.  Pulmonary:     Effort: Pulmonary effort is normal.     Breath sounds: Normal breath sounds.  Abdominal:     General: Bowel sounds are normal. There is no distension.     Palpations: Abdomen is soft.     Tenderness: There is no abdominal tenderness.  Musculoskeletal:        General: Normal range of motion.     Cervical back: Normal range of motion and neck supple.  Skin:    General: Skin is warm and dry.  Neurological:     Mental Status: She is alert and oriented to person, place, and time.  Psychiatric:        Behavior: Behavior normal.    Assessment/Plan 30 yo female with obesity -lap Roux-en-Y gastric bypass -ERAS protocol -inpatient admission  38, MD 03/21/2021, 10:02 AM

## 2021-03-21 NOTE — Op Note (Signed)
**Note Claudia-Identified via Obfuscation** Preop Diagnosis: Obesity Class III  Postop Diagnosis: same  Procedure performed: laparoscopic Roux en Y gastric bypass  Assitant: Ivar Drape  Indications:  The patient is a 30 y.o. year-old morbidly obese female who has been followed in the Bariatric Clinic as an outpatient. This patient was diagnosed with morbid obesity with a BMI of Body mass index is 39.46 kg/m. and significant co-morbidities including hypertension.  The patient was counseled extensively in the Bariatric Outpatient Clinic and after a thorough explanation of the risks and benefits of surgery (including death from complications, bowel leak, infection such as peritonitis and/or sepsis, internal hernia, bleeding, need for blood transfusion, bowel obstruction, organ failure, pulmonary embolus, deep venous thrombosis, wound infection, incisional hernia, skin breakdown, and others entailed on the consent form) and after a compliant diet and exercise program, the patient was scheduled for an elective laparoscopic gastric bypass.  Description of Operation:  Following informed consent, the patient was taken to the operating room and placed on the operating table in the supine position.  She had previously received prophylactic antibiotics and subcutaneous heparin for DVT prophylaxis in the pre-op holding area.  After induction of general endotracheal anesthesia by the anesthesiologist, the patient underwent placement of sequential compression devices, Foley catheter and an oro-gastric tube.  A timeout was confirmed by the surgery and anesthesia teams.  The patient was adequately padded at all pressure points and placed on a footboard to prevent slippage from the OR table during extremes of position during surgery.  She underwent a routine sterile prep and drape of her entire abdomen.    Next, A transverse incision was made under the left subcostal area and a 21mm optical viewing trocar was introduced into the peritoneal cavity.  Pneumoperitoneum was applied with a high flow and low pressure. A laparoscope was inserted to confirm placement. A extraperitoneal block was then placed at the lateral abdominal wall using exparel diluted with marcaine . 5 additional trocars were placed: 1 57mm trocar to the left of the midline. 1 additional 52mm trocar in the left lateral area, 1 35mm trocar in the right mid abdomen, and 1 47mm trocar in the right subcostal area.  The greater omentum was flipped over the transverse colon and under the left lobe of the liver. The ligament of trietz was identified. 40cm of jejunum was measured starting from the ligament of Trietz. The mesentery was checked to ensure mobility. Next, a 35mm 2-74mm tristapler was used to divide the jejunum at this location. The harmonic scalpel was used to divide the mesentery down to the origin. A 1/2" penrose was sutured to the distal side. 100cm of jejunum was measured starting at the division. 2-0 silk was used to appose the biliary limb to the 100cm mark of jejunum in 2 places. Enterotomies were made in the biliary and common channels and a 63mm 2-3 tristapler was used to create the J-J anastomosis. A 2-0 silk was used to appose the enterotomy edges and a 30mm 2-3 tristapler was used to close the enterotomy. An anti-obstruction 2-0 silk suture was placed. Next, the mesenteric defect was closed with a 2-0 silk in running fashion.The J-J appeared patent and in neutral position.  Next, the omentum was divided using the Harmonic scalpel. The patient was placed in steep Reverse Trendelenberg position. A Nathanson retracted was placed through a subxiphoid incision and used to retract the liver. The fat pad over the fundus was incised to free the fundus. Next, a position along the lesser curve 6cm from  GE junction was identified. The pars flaccida was entered and the fat over the lesser curve divided to enter the lesser sac. Multiple 60mm 3-58mm tristaple firings were peformed to create a  6cm pouch. The Roux limb was identified using the placed penrose and brought up to the stomach in antecolic fashion. The limb was inspected to ensure a neutral position. A 2-0 vicryl suture was then used to create a posterior layer connecting the stomach to the Roux limb jejunum in running fashion. Next cautery was used to create an enterotomy along the medial aspect of this suture line and Harmonic scalpel used to create gastotomy. A 33mm 3-83mm tristapler was then used to create a 25-46mm anastomosis. 2 2-0 vicryl sutures were used in running fashion to close the gastrotomy. Finally, a 2-0 vicryl suture was used to close an anterior layer of stomach and jejunum over the anastomosis in running fashion. The penrose was removed from the Roux limb. A 2-0 silk was used to appose the transverse mesocolon to the mesentery of the Roux limb.   The assistant then went and performed an upper endoscopy and leak test. The pouch length was measured at 5 cm. No bubbles were seen and the pouch and limb distended appropriately. The limb and pouch were deflated, the endoscope was removed. Hemostasis was ensured. Pneumoperitoneum was evacuated, all ports were removed and all incisions closed with 4-0 monocryl suture in subcuticular fashion. Steristrips and bandaids were put in place for dressing. The patient awoke from anesthesia and was brought to pacu in stable condition. All counts were correct.  Specimens:  None  Estimated Blood Loss: 20 ml  Local Anesthesia: 50 ml Exparel: 0.5% Marcaine Mix  Post-Op Plan:       Pain Management: PO, prn      Antibiotics: Prophylactic      Anticoagulation: Prophylactic, Starting now      Post Op Studies/Consults: Not applicable      Intended Discharge: within 48h      Intended Outpatient Follow-Up: Two Week      Intended Outpatient Studies: Not Applicable      Other: Not Applicable   Claudia Price

## 2021-03-21 NOTE — Anesthesia Procedure Notes (Signed)
Procedure Name: Intubation Date/Time: 03/21/2021 10:31 AM Performed by: Brennan Bailey, MD Pre-anesthesia Checklist: Patient identified, Emergency Drugs available, Suction available and Patient being monitored Patient Re-evaluated:Patient Re-evaluated prior to induction Oxygen Delivery Method: Circle System Utilized Preoxygenation: Pre-oxygenation with 100% oxygen Induction Type: IV induction Ventilation: Mask ventilation without difficulty Laryngoscope Size: Mac and 3 Grade View: Grade I Tube type: Oral Tube size: 7.5 mm Number of attempts: 1 Airway Equipment and Method: Stylet Placement Confirmation: ETT inserted through vocal cords under direct vision, positive ETCO2 and breath sounds checked- equal and bilateral Secured at: 22 cm Tube secured with: Tape Dental Injury: Teeth and Oropharynx as per pre-operative assessment  Comments: Intubated by paramedic student under my direct supervision. Reported grade 1 view. Atraumatic intubation. Daiva Huge, MD

## 2021-03-21 NOTE — Transfer of Care (Signed)
Immediate Anesthesia Transfer of Care Note  Patient: Claudia Price  Procedure(s) Performed: LAPAROSCOPIC ROUX-EN-Y GASTRIC BYPASS WITH UPPER ENDOSCOPY (Abdomen)  Patient Location: PACU  Anesthesia Type:General  Level of Consciousness: awake, drowsy and responds to stimulation  Airway & Oxygen Therapy: Patient Spontanous Breathing and Patient connected to face mask oxygen  Post-op Assessment: Report given to RN and Post -op Vital signs reviewed and stable  Post vital signs: Reviewed and stable  Last Vitals:  Vitals Value Taken Time  BP 141/75 03/21/21 1230  Temp    Pulse 74 03/21/21 1231  Resp 19 03/21/21 1231  SpO2 100 % 03/21/21 1231  Vitals shown include unvalidated device data.  Last Pain:  Vitals:   03/21/21 1004  TempSrc:   PainSc: 0-No pain      Patients Stated Pain Goal: 4 (03/21/21 1004)  Complications: No notable events documented.

## 2021-03-21 NOTE — Op Note (Signed)
   Patient: Claudia Price (11-28-1990, [id number])  Date of Surgery: 03/21/2021   Preoperative Diagnosis: MORBID OBESITY   Postoperative Diagnosis: MORBID OBESITY   Surgical Procedure: Upper Endoscopy   Surgeon: Ivar Drape, MD  Anesthesiologist: Kaylyn Layer, MD CRNA: Elyn Peers, CRNA; Nelle Don, CRNA   Anesthesia: General   Fluids:  Total I/O In: 100 [IV Piggyback:100] Out: 25 [Blood:25]  Complications: None  Drains:  None  Specimen: None   Indications for Procedure: [name] is a 30 y.o. female undergoing laparoscopic gastric bypass and an EGD was requested to evaluate foregut anatomy intraoperatively.  Description of Procedure: During the procedure, I scrubbed out and obtained the Olympus endoscope. I gently placed endoscope in the patient's oropharynx and gently glided it down the esophagus without any difficulty under direct visualization.  The scope was advanced as far as the roux limb and then slowly withdrawn to inspect the foregut anatomy.  Dr. Sheliah Hatch had placed saline in the upper abdomen and all staple lines were submerged to ensure no air leak. There was no evidence of bubbles. There was no evidence of intraluminal bleeding and the mucosa appeared healthy.  The lumen was widely patent without evidence of stricture.  The GE junction was at 40 cm and the GJ anastamosis at 45 cm.  The intraluminal insufflation was decompressed. The scope was withdrawn. The patient tolerated this portion of the procedure well. Please see Dr Guerry Minors operative note for details regarding the remainder of the procedure.    Ivar Drape, MD General, Bariatric, & Minimally Invasive Surgery [facility], [place]

## 2021-03-21 NOTE — Anesthesia Postprocedure Evaluation (Signed)
Anesthesia Post Note  Patient: Claudia Price  Procedure(s) Performed: LAPAROSCOPIC ROUX-EN-Y GASTRIC BYPASS WITH UPPER ENDOSCOPY (Abdomen)     Patient location during evaluation: PACU Anesthesia Type: General Level of consciousness: awake and alert and oriented Pain management: pain level controlled Vital Signs Assessment: post-procedure vital signs reviewed and stable Respiratory status: spontaneous breathing, nonlabored ventilation and respiratory function stable Cardiovascular status: blood pressure returned to baseline Postop Assessment: no apparent nausea or vomiting Anesthetic complications: no   No notable events documented.  Last Vitals:  Vitals:   03/21/21 1330 03/21/21 1400  BP: (!) 137/96 126/83  Pulse: (!) 58 62  Resp: 19 17  Temp:    SpO2: 100% 100%    Last Pain:  Vitals:   03/21/21 1330  TempSrc:   PainSc: Asleep                 Kaylyn Layer

## 2021-03-22 ENCOUNTER — Encounter (HOSPITAL_COMMUNITY): Payer: Self-pay | Admitting: General Surgery

## 2021-03-22 LAB — CBC WITH DIFFERENTIAL/PLATELET
Abs Immature Granulocytes: 0.03 10*3/uL (ref 0.00–0.07)
Basophils Absolute: 0 10*3/uL (ref 0.0–0.1)
Basophils Relative: 0 %
Eosinophils Absolute: 0 10*3/uL (ref 0.0–0.5)
Eosinophils Relative: 0 %
HCT: 35.9 % — ABNORMAL LOW (ref 36.0–46.0)
Hemoglobin: 11.9 g/dL — ABNORMAL LOW (ref 12.0–15.0)
Immature Granulocytes: 0 %
Lymphocytes Relative: 13 %
Lymphs Abs: 1.1 10*3/uL (ref 0.7–4.0)
MCH: 29.1 pg (ref 26.0–34.0)
MCHC: 33.1 g/dL (ref 30.0–36.0)
MCV: 87.8 fL (ref 80.0–100.0)
Monocytes Absolute: 0.6 10*3/uL (ref 0.1–1.0)
Monocytes Relative: 7 %
Neutro Abs: 6.8 10*3/uL (ref 1.7–7.7)
Neutrophils Relative %: 80 %
Platelets: 250 10*3/uL (ref 150–400)
RBC: 4.09 MIL/uL (ref 3.87–5.11)
RDW: 14.3 % (ref 11.5–15.5)
WBC: 8.5 10*3/uL (ref 4.0–10.5)
nRBC: 0 % (ref 0.0–0.2)

## 2021-03-22 LAB — COMPREHENSIVE METABOLIC PANEL
ALT: 15 U/L (ref 0–44)
AST: 17 U/L (ref 15–41)
Albumin: 4 g/dL (ref 3.5–5.0)
Alkaline Phosphatase: 36 U/L — ABNORMAL LOW (ref 38–126)
Anion gap: 10 (ref 5–15)
BUN: 6 mg/dL (ref 6–20)
CO2: 25 mmol/L (ref 22–32)
Calcium: 9.1 mg/dL (ref 8.9–10.3)
Chloride: 104 mmol/L (ref 98–111)
Creatinine, Ser: 0.54 mg/dL (ref 0.44–1.00)
GFR, Estimated: >60 mL/min (ref >60)
Glucose, Bld: 153 mg/dL — ABNORMAL HIGH (ref 70–99)
Potassium: 4.1 mmol/L (ref 3.5–5.1)
Sodium: 139 mmol/L (ref 135–145)
Total Bilirubin: 0.5 mg/dL (ref 0.3–1.2)
Total Protein: 6.7 g/dL (ref 6.5–8.1)

## 2021-03-22 MED ORDER — PANTOPRAZOLE SODIUM 40 MG PO TBEC: 40.0000 mg | DELAYED_RELEASE_TABLET | Freq: Every day | ORAL | 0 refills | Status: DC

## 2021-03-22 MED ORDER — ACETAMINOPHEN 500 MG PO TABS: 1000.0000 mg | ORAL_TABLET | Freq: Three times a day (TID) | ORAL | 0 refills | Status: AC

## 2021-03-22 MED ORDER — ONDANSETRON 4 MG PO TBDP: 4.0000 mg | ORAL_TABLET | Freq: Four times a day (QID) | ORAL | 0 refills | Status: DC | PRN

## 2021-03-22 NOTE — Progress Notes (Signed)
24hr fluid recall: 1080mL. Per dehydration protocol, will call pt to f/u within one week post op. 

## 2021-03-22 NOTE — Progress Notes (Signed)
Nutrition Education Note ° °Received consult for diet education for patient s/p bariatric surgery. ° °Discussed 2 week post op diet with pt. Emphasized that liquids must be non carbonated, non caffeinated, and sugar free. Fluid goals discussed. Pt to follow up with outpatient bariatric RD for further diet progression after 2 weeks. Multivitamins and minerals also reviewed. Teach back method used, pt expressed understanding, expect good compliance. ° °If nutrition issues arise, please consult RD. ° °Kahner Yanik, MS, RD, LDN °Inpatient Clinical Dietitian °Contact information available via Amion ° ° °

## 2021-03-22 NOTE — Discharge Summary (Signed)
Physician Discharge Summary  Claudia Price JSE:[id number] DOB: 1990/09/18 DOA: 03/21/2021  PCP: Julianne Handler, NP  Admit date: 03/21/2021 Discharge date: 03/22/2021  Recommendations for Outpatient Follow-up:   (include homehealth, outpatient follow-up instructions, specific recommendations for PCP to follow-up on, etc.)   Follow-up Information     Alexandera Kuntzman, De Blanch, MD. Go on 04/12/2021.   Specialty: General Surgery Why: at 9am.  Please arrive 15 minutes prior to your appointment time.  Thank you. Contact information: [address] [place] [address] [phone]         Surgery, [place]. Go on 05/04/2021.   Specialty: General Surgery Why: at 11am with Dr. Sheliah Hatch.  Please arrive 15 minutes prior to your appointment time.  Thank you. Contact information: [address] [place] [address] [phone]                Discharge Diagnoses:  Active Problems:   Morbid (severe) obesity due to excess calories [facility])   Surgical Procedure: Roux-en-Y gastric bypass, upper endoscopy  Discharge Condition: Good Disposition: Home  Diet recommendation: Postoperative sleeve gastrectomy diet (liquids only)  Filed Weights   03/21/21 0945 03/21/21 1004  Weight: 121.2 kg 121.2 kg     Hospital Course:  The patient was admitted after undergoing Roux-en-Y gastric bypass. POD 0 she ambulated well. POD 1 she was started on the water diet protocol and tolerated 400 ml in the first shift. Once meeting the water amount she was advanced to bariatric protein shakes which they tolerated and were discharged home POD 1.  Treatments: surgery: Roux-en-Y gastric bypass  Discharge Instructions  Discharge Instructions     Ambulate hourly while awake   Complete by: As directed    Call MD for:  difficulty breathing, headache or visual disturbances   Complete by: As directed    Call MD for:  persistant dizziness or light-headedness   Complete  by: As directed    Call MD for:  persistant nausea and vomiting   Complete by: As directed    Call MD for:  redness, tenderness, or signs of infection (pain, swelling, redness, odor or green/yellow discharge around incision site)   Complete by: As directed    Call MD for:  severe uncontrolled pain   Complete by: As directed    Call MD for:  temperature >101 F   Complete by: As directed    Diet bariatric full liquid   Complete by: As directed    Discharge wound care:   Complete by: As directed    Remove Bandaids tomorrow, ok to shower tomorrow. Steristrips may fall off in 1-3 weeks.   Incentive spirometry   Complete by: As directed    Perform hourly while awake      Allergies as of 03/22/2021       Reactions   Cat Hair Extract Cough, Itching, Shortness Of Breath   Dust Mite Extract Cough, Itching, Shortness Of Breath   Molds & Smuts Cough, Itching, Shortness Of Breath, Swelling   Casein Other (See Comments), Swelling   Dairy Aid [lactase]    Gluten Meal    Soy Allergy         Medication List     STOP taking these medications    ibuprofen 800 MG tablet Commonly known as: ADVIL       TAKE these medications    acetaminophen 500 MG tablet Commonly known as: TYLENOL Take 2 tablets (1,000 mg total) by mouth every  8 (eight) hours for 5 days.   albuterol 108 (90 Base) MCG/ACT inhaler Commonly known as: VENTOLIN HFA INHALE 2 PUFFS BY MOUTH EVERY 4 HOURS AS NEEDED What changed: reasons to take this   budesonide-formoterol 160-4.5 MCG/ACT inhaler Commonly known as: Symbicort INHALE 2 PUFFS TWO TIMES DAILY WITH SPACER What changed:  how much to take how to take this when to take this additional instructions   Calcium Citrate Chewy Bite 500-500 MG-UNIT chewable tablet Generic drug: calcium citrate-vitamin D Chew 1 tablet by mouth 3 (three) times daily.   Celebrate Multi-Complete 45 Chew Chew 1 tablet by mouth in the morning.   fluticasone 50 MCG/ACT nasal  spray Commonly known as: FLONASE SPRAY 1-2 SPRAYS INTO BOTH NOSTRILS DAILY. What changed: See the new instructions.   levocetirizine 5 MG tablet Commonly known as: XYZAL TAKE 1 TABLET BY MOUTH EVERY DAY IN THE EVENING What changed:  how much to take how to take this when to take this   levothyroxine 175 MCG tablet Commonly known as: SYNTHROID Take 175 mcg by mouth at bedtime.   ondansetron 4 MG disintegrating tablet Commonly known as: ZOFRAN-ODT Take 1 tablet (4 mg total) by mouth every 6 (six) hours as needed for nausea or vomiting.   pantoprazole 40 MG tablet Commonly known as: PROTONIX Take 1 tablet (40 mg total) by mouth daily.   paragard intrauterine copper Iud IUD 1 each by Intrauterine route once.               Discharge Care Instructions  (From admission, onward)           Start     Ordered   03/22/21 0000  Discharge wound care:       Comments: Remove Bandaids tomorrow, ok to shower tomorrow. Steristrips may fall off in 1-3 weeks.   03/22/21 0902            Follow-up Information     Berlin Mokry, De Blanch, MD. Go on 04/12/2021.   Specialty: General Surgery Why: at 9am.  Please arrive 15 minutes prior to your appointment time.  Thank you. Contact information: [address] [place] [address] [phone]         Surgery, [place]. Go on 05/04/2021.   Specialty: General Surgery Why: at 11am with Dr. Sheliah Hatch.  Please arrive 15 minutes prior to your appointment time.  Thank you. Contact information: [address] [place] [address] [phone]                  The results of significant diagnostics from this hospitalization (including imaging, microbiology, ancillary and laboratory) are listed below for reference.    Significant Diagnostic Studies: No results found.  Labs: Basic Metabolic Panel: Recent Labs  Lab 03/15/21 1507 03/21/21 1242 03/22/21 0347  NA 136 138 139  K 4.2 3.8  4.1  CL 100 101 104  CO2 25 25 25   GLUCOSE 88 129* 153*  BUN 17 11 6   CREATININE 0.63 0.74 0.54  CALCIUM 9.5 8.7* 9.1   Liver Function Tests: Recent Labs  Lab 03/21/21 1242 03/22/21 0347  AST 20 17  ALT 16 15  ALKPHOS 38 36*  BILITOT 0.6 0.5  PROT 6.9 6.7  ALBUMIN 4.2 4.0    CBC: Recent Labs  Lab 03/15/21 1507 03/21/21 1242 03/22/21 0347  WBC 7.0 7.8 8.5  NEUTROABS  --  6.7 6.8  HGB 13.1 12.3 11.9*  HCT 39.9 37.0 35.9*  MCV 87.9  88.3 87.8  PLT 247 225 250    CBG: No results for input(s): GLUCAP in the last 168 hours.  Active Problems:   Morbid (severe) obesity due to excess calories (HCC)   VTE plan: no chemical prophylaxis recommended ([email or web address])  Time coordinating discharge: 15 min

## 2021-03-22 NOTE — Plan of Care (Signed)
  Problem: Education: Goal: Knowledge of General Education information will improve Description: Including pain rating scale, medication(s)/side effects and non-pharmacologic comfort measures Outcome: Adequate for Discharge   Problem: Health Behavior/Discharge Planning: Goal: Ability to manage health-related needs will improve Outcome: Adequate for Discharge   Problem: Clinical Measurements: Goal: Ability to maintain clinical measurements within normal limits will improve Outcome: Adequate for Discharge Goal: Will remain free from infection Outcome: Adequate for Discharge Goal: Diagnostic test results will improve Outcome: Adequate for Discharge Goal: Respiratory complications will improve Outcome: Adequate for Discharge Goal: Cardiovascular complication will be avoided Outcome: Adequate for Discharge   Problem: Activity: Goal: Risk for activity intolerance will decrease Outcome: Adequate for Discharge   Problem: Nutrition: Goal: Adequate nutrition will be maintained Outcome: Adequate for Discharge   Problem: Coping: Goal: Level of anxiety will decrease Outcome: Adequate for Discharge   Problem: Elimination: Goal: Will not experience complications related to bowel motility Outcome: Adequate for Discharge Goal: Will not experience complications related to urinary retention Outcome: Adequate for Discharge   Problem: Pain Managment: Goal: General experience of comfort will improve Outcome: Adequate for Discharge   Problem: Safety: Goal: Ability to remain free from injury will improve Outcome: Adequate for Discharge   Problem: Skin Integrity: Goal: Risk for impaired skin integrity will decrease Outcome: Adequate for Discharge   Problem: Education: Goal: Ability to state signs and symptoms to report to health care provider will improve Outcome: Adequate for Discharge Goal: Knowledge of the prescribed self-care regimen will improve Outcome: Adequate for  Discharge Goal: Knowledge of discharge needs will improve Outcome: Adequate for Discharge   Problem: Activity: Goal: Ability to tolerate increased activity will improve Outcome: Adequate for Discharge   Problem: Bowel/Gastric: Goal: Gastrointestinal status for postoperative course will improve Outcome: Adequate for Discharge Goal: Occurrences of nausea will decrease Outcome: Adequate for Discharge   Problem: Coping: Goal: Development of coping mechanisms to deal with changes in body function or appearance will improve Outcome: Adequate for Discharge   Problem: Fluid Volume: Goal: Maintenance of adequate hydration will improve Outcome: Adequate for Discharge   Problem: Nutritional: Goal: Nutritional status will improve Outcome: Adequate for Discharge   Problem: Clinical Measurements: Goal: Will show no signs or symptoms of venous thromboembolism Outcome: Adequate for Discharge Goal: Will remain free from infection Outcome: Adequate for Discharge Goal: Will show no signs of GI Leak Outcome: Adequate for Discharge   Problem: Respiratory: Goal: Will regain and/or maintain adequate ventilation Outcome: Adequate for Discharge   Problem: Pain Management: Goal: Pain level will decrease Outcome: Adequate for Discharge   Problem: Skin Integrity: Goal: Demonstration of wound healing without infection will improve Outcome: Adequate for Discharge   

## 2021-03-22 NOTE — Progress Notes (Signed)
Patient alert and oriented, pain is controlled. Patient is tolerating fluids, advanced to protein shake today, patient is tolerating well. Reviewed Gastric Bypass discharge instructions with patient and patient is able to articulate understanding. Provided information on BELT program, Support Group and [facility] outpatient pharmacy. All questions answered, will continue to monitor.    

## 2021-03-22 NOTE — Discharge Instructions (Signed)

## 2021-03-27 ENCOUNTER — Telehealth (HOSPITAL_COMMUNITY): Payer: Self-pay | Admitting: *Deleted

## 2021-03-27 NOTE — Telephone Encounter (Signed)
1.  Tell me about your pain and pain management? Pt denies any current pain. Pt states that she has been experiencing some incisional pain to the right lower abdomen beside the umbilicus.  Pt states that the pain is tolerable.  Pt instructed to call [facility] if pain worsens.  2.  Let's talk about fluid intake.  How much total fluid are you taking in? Pt states that she is getting in more than 64oz of fluid including protein shakes, bottled water, and broth.  3.  How much protein have you taken in the last 2 days? Pt states she is meeting her goal of 60g of protein each day with the protein shakes.  4.  Have you had nausea?  Tell me about when have experienced nausea and what you did to help? Pt denies any current nausea.   5.  Has the frequency or color changed with your urine? Pt states that she is urinating "fine" with no changes in frequency or urgency.     6.  Tell me what your incisions look like? "Incisions look fine". Pt denies a fever, chills.  Pt states incisions are not swollen, open, or draining.  Pt encouraged to call [facility] if incisions change.   7.  Have you been passing gas? BM? Pt states that she is having BMs. Last BM 03/26/21.     8.  If a problem or question were to arise who would you call?  Do you know contact numbers for [facility], [facility], and [facility]? Pt denies dehydration symptoms.  Pt can describe s/sx of dehydration.  Pt knows to call [facility] for surgical, [facility] for nutrition, and [facility] for non-urgent questions or concerns.   9.  How has the walking going? Pt states she is walking around and able to be active without difficulty.   10. Are you still using your incentive spirometer?  If so, how often? Pt states that she is using her IS frequently.  Pt encouraged to use incentive spirometer, at least 10x every hour while awake until she sees the surgeon.  11.  How are your vitamins and calcium going?  How are you taking them? Pt states that she is taking her supplements and vitamins without  difficulty.   Reminded patient that the first 30 days post-operatively are important for successful recovery.  Practice good hand hygiene, wearing a mask when appropriate (since optional in most places), and minimizing exposure to people who live outside of the home, especially if they are exhibiting any respiratory, GI, or illness-like symptoms.

## 2021-04-04 ENCOUNTER — Encounter: Payer: 59 | Attending: General Surgery | Admitting: Skilled Nursing Facility1

## 2021-04-04 ENCOUNTER — Other Ambulatory Visit: Payer: Self-pay

## 2021-04-04 DIAGNOSIS — E669 Obesity, unspecified: Secondary | ICD-10-CM | POA: Insufficient documentation

## 2021-04-04 NOTE — Progress Notes (Signed)
2 Week Post-Operative Nutrition Class   Patient was seen on 04/04/2021 for Post-Operative Nutrition education at the [facility].    Surgery date: 03/21/2021 Surgery type: RYGB Start weight at [facility]: 288.8 Weight today: 259 Bowel Habits: Every day to every other day no complaints   Body Composition Scale 04/04/2021  Current Body Weight 259  Total Body Fat % 42.1  Visceral Fat 11  Fat-Free Mass % 57.8   Total Body Water % 43.4  Muscle-Mass lbs 37  BMI 38  Body Fat Displacement          Torso  lbs 67.6         Left Leg  lbs 13.5         Right Leg  lbs 13.5         Left Arm  lbs 6.7         Right Arm   lbs 6.7      The following the learning objectives were met by the patient during this course: Identifies Phase 3 (Soft, High Proteins) Dietary Goals and will begin from 2 weeks post-operatively to 2 months post-operatively Identifies appropriate sources of fluids and proteins  Identifies appropriate fat sources and healthy verses unhealthy fat types   States protein recommendations and appropriate sources post-operatively Identifies the need for appropriate texture modifications, mastication, and bite sizes when consuming solids Identifies appropriate fat consumption and sources Identifies appropriate multivitamin and calcium sources post-operatively Describes the need for physical activity post-operatively and will follow MD recommendations States when to call healthcare provider regarding medication questions or post-operative complications   Handouts given during class include: Phase 3A: Soft, High Protein Diet Handout Phase 3 High Protein Meals Healthy Fats   Follow-Up Plan: Patient will follow-up at [facility] in 6 weeks for 2 month post-op nutrition visit for diet advancement per MD.

## 2021-04-10 ENCOUNTER — Telehealth: Payer: Self-pay | Admitting: Skilled Nursing Facility1

## 2021-04-10 NOTE — Telephone Encounter (Signed)
RD called pt to verify fluid intake once starting soft, solid proteins 2 week post-bariatric surgery.   Daily Fluid intake: 64 oz Daily Protein intake: 60 g Bowel Habits: every day to every other day with milk of magnesia  Concerns/issues:   None stated

## 2021-05-10 ENCOUNTER — Other Ambulatory Visit: Payer: Self-pay

## 2021-05-10 ENCOUNTER — Encounter: Payer: 59 | Attending: General Surgery | Admitting: Skilled Nursing Facility1

## 2021-05-10 DIAGNOSIS — E669 Obesity, unspecified: Secondary | ICD-10-CM | POA: Insufficient documentation

## 2021-05-10 NOTE — Progress Notes (Signed)
Bariatric Nutrition Follow-Up Visit Medical Nutrition Therapy   NUTRITION ASSESSMENT    Surgery date: 03/21/2021 Surgery type: RYGB Start weight at [facility]: 288.8 Weight today: 241.6 pounds   Body Composition Scale 04/04/2021 05/10/2021  Current Body Weight 259 241.6  Total Body Fat % 42.1 39.6  Visceral Fat 11 9  Fat-Free Mass % 57.8 60.3   Total Body Water % 43.4 44.6  Muscle-Mass lbs 37 37.5  BMI 38 34.6  Body Fat Displacement           Torso  lbs 67.6 59.4         Left Leg  lbs 13.5 11.8         Right Leg  lbs 13.5 11.8         Left Arm  lbs 6.7 5.9         Right Arm   lbs 6.7 5.9   Clinical  Medical hx: Anemia Medications: Thyroid Labs:  Notable signs/symptoms: intolerance to soy, dairy, wheat (feeling puffy or bloated) Any previous deficiencies? Iron   Lifestyle & Dietary Hx  Pt states she needs to do better with her water but has a solid plan to increase that. Pt states her energy level has been really good. Pt states she does weigh all of her foods. Pt sates she looks at all labels before eating anything and will Google it if it does not have a label. Pt states she uses smaller plates and utensils.   Pt is doing really well and in a great place behaviorally.   Estimated daily fluid intake: 64+ oz Estimated daily protein intake: 65+ g Supplements: multi and calcium Current average weekly physical activity: cardio and weights 3 days a week 60 minutes   24-Hr Dietary Recall First Meal: 1-2 eggs + cheese or protein shake Snack:   Second Meal: cheese + deli meat or shredded chicken + queso Snack:  cheese stick Third Meal: steamed shrimp + sugar free sauce or [place] chili Snack:  Beverages: water, coffee (death wish sometimes)  Post-Op Goals/ Signs/ Symptoms Using straws: no Drinking while eating: no Chewing/swallowing difficulties: no Changes in vision: no Changes to mood/headaches: no Hair loss/changes to skin/nails: no Difficulty focusing/concentrating:  no Sweating: no Dizziness/lightheadedness: no Palpitations: no Carbonated/caffeinated beverages: no N/V/D/C/Gas: no Abdominal pain: no Dumping syndrome: no    NUTRITION DIAGNOSIS  Overweight/obesity ([place]-3.3) related to past poor dietary habits and physical inactivity as evidenced by completed bariatric surgery and following dietary guidelines for continued weight loss and healthy nutrition status.     NUTRITION INTERVENTION Nutrition counseling (C-1) and education (E-2) to facilitate bariatric surgery goals, including: Diet advancement to the next phase (phase 4) now including non starchy vegetables  The importance of consuming adequate calories as well as certain nutrients daily due to the body's need for essential vitamins, minerals, and fats The importance of daily physical activity and to reach a goal of at least 150 minutes of moderate to vigorous physical activity weekly (or as directed by their physician) due to benefits such as increased musculature and improved lab values The importance of intuitive eating specifically learning hunger-satiety cues and understanding the importance of learning a new body: The importance of mindful eating to avoid grazing behaviors   Goals: -Continue to aim for a minimum of 64 fluid ounces 7 days a week with at least 30 ounces being plain water  -Eat non-starchy vegetables 2 times a day 7 days a week  -Start out with soft cooked vegetables today and tomorrow;  if tolerated begin to eat raw vegetables or cooked including salads  -Eat your 3 ounces of protein first then start in on your non-starchy vegetables; once you understand how much of your meal leads to satisfaction and not full while still eating 3 ounces of protein and non-starchy vegetables you can eat them in any order   -Continue to aim for 30 minutes of activity at least 5 times a week  -Do NOT cook with/add to your food: alfredo sauce, cheese sauce, barbeque sauce, ketchup, fat back,  butter, bacon grease, grease, Crisco, OR SUGAR   Handouts Provided Include  Phase 4  Learning Style & Readiness for Change Teaching method utilized: Visual & Auditory  Demonstrated degree of understanding via: Teach Back  Readiness Level: Action Barriers to learning/adherence to lifestyle change: none   RD's Notes for Next Visit Assess adherence to pt chosen goals    MONITORING & EVALUATION Dietary intake, weekly physical activity, body weight  Next Steps Patient is to follow-up in 3 months

## 2021-07-05 ENCOUNTER — Other Ambulatory Visit: Payer: Self-pay

## 2021-07-05 ENCOUNTER — Encounter: Payer: 59 | Attending: General Surgery | Admitting: Skilled Nursing Facility1

## 2021-07-05 DIAGNOSIS — E669 Obesity, unspecified: Secondary | ICD-10-CM | POA: Insufficient documentation

## 2021-07-05 NOTE — Progress Notes (Signed)
Bariatric Nutrition Follow-Up Visit Medical Nutrition Therapy   NUTRITION ASSESSMENT    Surgery date: 03/21/2021 Surgery type: RYGB Start weight at [facility]: 288.8 Weight today: 218.5 pounds   Body Composition Scale 04/04/2021 05/10/2021 07/05/2021  Current Body Weight 259 241.6 218.6  Total Body Fat % 42.1 39.6 38  Visceral Fat 11 9 9   Fat-Free Mass % 57.8 60.3 61.9   Total Body Water % 43.4 44.6 45.4  Muscle-Mass lbs 37 37.5 35.6  BMI 38 34.6 33.1  Body Fat Displacement            Torso  lbs 67.6 59.4 51.4         Left Leg  lbs 13.5 11.8 10.2         Right Leg  lbs 13.5 11.8 10.2         Left Arm  lbs 6.7 5.9 5.1         Right Arm   lbs 6.7 5.9 5.1   Clinical  Medical hx: Anemia Medications: Thyroid Labs:  Notable signs/symptoms: intolerance to soy, dairy, wheat (feeling puffy or bloated)-no longer having issues with these Any previous deficiencies? Iron   Lifestyle & Dietary Hx Moved to [facility]  Pt is doing really well and in a great place behaviorally.   Pt states she is still struggling to get in her fluid. Pt states she has been feeling tired lately due to moving into her new home. Pt state he apartment has a really nice gym that she is excited to use.  Pt states before surgery she used to pull all nightiers but since having surgery she has not been doing that and has been sleeping at night.   Estimated daily fluid intake: 64+ oz Estimated daily protein intake: 65+ g Supplements: multi and calcium Current average weekly physical activity: cardio and weights 3 days a week 60 minutes   24-Hr Dietary Recall First Meal: 1-2 eggs + cheese + sausage or protein shake Snack:   Second Meal: cheese + deli meat or shredded chicken + queso or salad + chicken + olives + cheese  Snack:  cheese stick Third Meal: steamed shrimp + sugar free sauce or [facility] chili or steak + beans  Snack:  Beverages: water, coffee (death wish sometimes)  Post-Op Goals/ Signs/ Symptoms Using  straws: no Drinking while eating: no Chewing/swallowing difficulties: no Changes in vision: no Changes to mood/headaches: no Hair loss/changes to skin/nails: no Difficulty focusing/concentrating: no Sweating: no Dizziness/lightheadedness: no Palpitations: no Carbonated/caffeinated beverages: no N/V/D/C/Gas: no Abdominal pain: no Dumping syndrome: no    NUTRITION DIAGNOSIS  Overweight/obesity ([place]-3.3) related to past poor dietary habits and physical inactivity as evidenced by completed bariatric surgery and following dietary guidelines for continued weight loss and healthy nutrition status.     NUTRITION INTERVENTION Nutrition counseling (C-1) and education (E-2) to facilitate bariatric surgery goals, including: Diet advancement to the next phase (phase 5) now including starchy vegetables  The importance of consuming adequate calories as well as certain nutrients daily due to the body's need for essential vitamins, minerals, and fats The importance of daily physical activity and to reach a goal of at least 150 minutes of moderate to vigorous physical activity weekly (or as directed by their physician) due to benefits such as increased musculature and improved lab values The importance of intuitive eating specifically learning hunger-satiety cues and understanding the importance of learning a new body: The importance of mindful eating to avoid grazing behaviors    Handouts Provided Include  Phase 5  Learning Style & Readiness for Change Teaching method utilized: Visual & Auditory  Demonstrated degree of understanding via: Teach Back  Readiness Level: Action Barriers to learning/adherence to lifestyle change: none   RD's Notes for Next Visit Assess adherence to pt chosen goals    MONITORING & EVALUATION Dietary intake, weekly physical activity, body weight  Next Steps Patient is to follow-up in 3 months

## 2021-10-11 ENCOUNTER — Encounter: Payer: 59 | Attending: General Surgery | Admitting: Skilled Nursing Facility1

## 2021-10-11 ENCOUNTER — Other Ambulatory Visit: Payer: Self-pay

## 2021-10-11 DIAGNOSIS — E669 Obesity, unspecified: Secondary | ICD-10-CM | POA: Diagnosis not present

## 2021-10-11 NOTE — Progress Notes (Signed)
Bariatric Nutrition Follow-Up Visit Medical Nutrition Therapy   NUTRITION ASSESSMENT    Surgery date: 03/21/2021 Surgery type: RYGB Start weight at [facility]: 288.8 Weight today: 208.6 pounds   Body Composition Scale 04/04/2021 05/10/2021 07/05/2021 10/11/2021  Current Body Weight 259 241.6 218.6 208.6  Total Body Fat % 42.1 39.6 38 36.2  Visceral Fat 11 9 9 8   Fat-Free Mass % 57.8 60.3 61.9 63.7   Total Body Water % 43.4 44.6 45.4 46.3  Muscle-Mass lbs 37 37.5 35.6 35.8  BMI 38 34.6 33.1 31.3  Body Fat Displacement             Torso  lbs 67.6 59.4 51.4 46.8         Left Leg  lbs 13.5 11.8 10.2 9.3         Right Leg  lbs 13.5 11.8 10.2 9.3         Left Arm  lbs 6.7 5.9 5.1 4.6         Right Arm   lbs 6.7 5.9 5.1 4.6   Clinical  Medical hx: Anemia Medications: Thyroid Labs:  Notable signs/symptoms: intolerance to soy, dairy, wheat (feeling puffy or bloated)-no longer having issues with these Any previous deficiencies? Iron   Lifestyle & Dietary Hx Moved to [facility]  Pt is doing really well and in a great place behaviorally.   Pt states she has been well with good energy. Pt states she has nap chair in her office that she loves for great naps.  Pt states she is trying to get her sleep routine back.  Pt discussed possibly wanting skin removal surgery.    Estimated daily fluid intake: 64+ oz Estimated daily protein intake: 65+ g Supplements: multi and calcium Current average weekly physical activity: lost a bit a consistency recently   24-Hr Dietary Recall First Meal: coffee + fairlife milk and protein shake Snack:   Second Meal: chicken wraps + some potato Snack:  cheese stick Third Meal: hamburger + potatoes +  Snack:  Beverages: water, coffee  Post-Op Goals/ Signs/ Symptoms Using straws: no Drinking while eating: no Chewing/swallowing difficulties: no Changes in vision: no Changes to mood/headaches: no Hair loss/changes to skin/nails: no Difficulty  focusing/concentrating: no Sweating: no Dizziness/lightheadedness: no Palpitations: no Carbonated/caffeinated beverages: no N/V/D/C/Gas: some constipation  Abdominal pain: no Dumping syndrome: no    NUTRITION DIAGNOSIS  Overweight/obesity ([place]-3.3) related to past poor dietary habits and physical inactivity as evidenced by completed bariatric surgery and following dietary guidelines for continued weight loss and healthy nutrition status.     NUTRITION INTERVENTION Nutrition counseling (C-1) and education (E-2) to facilitate bariatric surgery goals, including: The importance of consuming adequate calories as well as certain nutrients daily due to the body's need for essential vitamins, minerals, and fats The importance of daily physical activity and to reach a goal of at least 150 minutes of moderate to vigorous physical activity weekly (or as directed by their physician) due to benefits such as increased musculature and improved lab values The importance of intuitive eating specifically learning hunger-satiety cues and understanding the importance of learning a new body: The importance of mindful eating to avoid grazing behaviors  Importance of vegetables To have an overall healthy diet, adult men and women are recommended to consume anywhere from 2-3 cups of vegetables daily. Vegetables provide a wide range of vitamins and minerals such as vitamin A, vitamin C, potassium, and folic acid. According to the [facility], including fruit and vegetables daily may reduce  the risk of cardiovascular disease, certain cancers, and other non-communicable diseases. Encouraged patient to honor their body's internal hunger and fullness cues.  Throughout the day, check in mentally and rate hunger. Stop eating when satisfied not full regardless of how much food is left on the plate.  Get more if still hungry 20-30 minutes later.  The key is to honor satisfaction so throughout the meal, rate  fullness factor and stop when comfortably satisfied not physically full. The key is to honor hunger and fullness without any feelings of guilt or shame.  Pay attention to what the internal cues are, rather than any external factors. This will enhance the confidence you have in listening to your own body and following those internal cues enabling you to increase how often you eat when you are hungry not out of appetite and stop when you are satisfied not full.  Encouraged pt to continue to eat balanced meals inclusive of non starchy vegetables 2 times a day 7 days a week Encouraged pt to choose lean protein sources: limiting beef, pork, sausage, hotdogs, and lunch meat Encourage pt to choose healthy fats such as plant based limiting animal fats Encouraged pt to continue to drink a minium 64 fluid ounces with half being plain water to satisfy proper hydration    Handouts Provided Include  Meal ideas sheet  Learning Style & Readiness for Change Teaching method utilized: Visual & Auditory  Demonstrated degree of understanding via: Teach Back  Readiness Level: Action Barriers to learning/adherence to lifestyle change: none   RD's Notes for Next Visit Assess adherence to pt chosen goals    MONITORING & EVALUATION Dietary intake, weekly physical activity, body weight  Next Steps Patient is to follow-up in June/July

## 2021-10-16 ENCOUNTER — Encounter: Payer: Self-pay | Admitting: Allergy & Immunology

## 2021-10-16 ENCOUNTER — Ambulatory Visit (INDEPENDENT_AMBULATORY_CARE_PROVIDER_SITE_OTHER): Payer: 59 | Admitting: Allergy & Immunology

## 2021-10-16 ENCOUNTER — Other Ambulatory Visit: Payer: Self-pay

## 2021-10-16 DIAGNOSIS — J4521 Mild intermittent asthma with (acute) exacerbation: Secondary | ICD-10-CM

## 2021-10-16 DIAGNOSIS — J3089 Other allergic rhinitis: Secondary | ICD-10-CM

## 2021-10-16 DIAGNOSIS — K9049 Malabsorption due to intolerance, not elsewhere classified: Secondary | ICD-10-CM

## 2021-10-16 MED ORDER — TRIAMCINOLONE ACETONIDE 55 MCG/ACT NA AERO: 2.0000 | INHALATION_SPRAY | Freq: Every day | NASAL | 5 refills | Status: DC

## 2021-10-16 MED ORDER — BUDESONIDE-FORMOTEROL FUMARATE 160-4.5 MCG/ACT IN AERO: INHALATION_SPRAY | RESPIRATORY_TRACT | 5 refills | Status: DC

## 2021-10-16 MED ORDER — PREDNISONE 10 MG PO TABS: ORAL_TABLET | ORAL | 0 refills | Status: DC

## 2021-10-16 MED ORDER — LEVOCETIRIZINE DIHYDROCHLORIDE 5 MG PO TABS: 5.0000 mg | ORAL_TABLET | Freq: Every evening | ORAL | 5 refills | Status: DC

## 2021-10-16 NOTE — Progress Notes (Signed)
RE: Claudia Price MRN: [id number] DOB: 11-21-1990 Date of Telemedicine Visit: 10/16/2021  Referring provider: Elenore Paddy, NP Primary care provider: Elenore Paddy, NP  Chief Complaint: Cough   Telemedicine Follow Up Visit via Telephone: I connected with Claudia Price for a follow up on 10/16/21 by telephone and verified that I am speaking with the correct person using two identifiers.   I discussed the limitations, risks, security and privacy concerns of performing an evaluation and management service by telephone and the availability of in person appointments. I also discussed with the patient that there may be a patient responsible charge related to this service. The patient expressed understanding and agreed to proceed.  Patient is at work.  Provider is at the office.  Visit start time: 11:59 AM Visit end time: 12:25 PM Insurance consent/check in by: [facility] consent and medical assistant/nurse: Cree  History of Present Illness:  She is a 31 y.o. female, who is being followed for allergic rhinitis as well as intermittent asthma and food intolerance. Her previous allergy office visit was in February 2022 with myself.  At that time, her allergies were under control with Xyzal and Flonase.  She reported a history of adverse food reactions.  Recommended continued avoidance, although testing has been negative.  Her asthma was not well controlled.  We started her on a prednisone burst.  We recommended trying to use the Symbicort 2 puffs twice daily every day to see if this provided the need for any additional steroids.  We also continue with albuterol as needed.  Since last visit, she has largely done well.  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  She also moved to [place], [place] where she is enjoying her new job.  She calls today because she started having viral URI symptoms around 2 weeks ago. There was a viral URI that went through the  house.  This was 2 weeks ago.  Most everyone else has recovered aside from her daughter,  Claudia Price, there is also a patient with [name]. Claudia Price reports that she has had a lingering cough.  She has not had a fever.  Most of her symptoms are isolated to her lungs.  She does report some chest pain with coughing.  This is mostly a dry cough.  When she is breathing normally and not coughing, she is not having any chest pain.  Last prednisone was February 2022.  Asthma/Respiratory Symptom History: She is on the Symbicort two puffs twice daily.  However, she tells me that she lost the inhaler. She last used it this past July 2022. She was fine from July through the end of the year without anything on board.   Allergic Rhinitis Symptom History: She remains on the Xyzal 5mg  daily. She does need some  refills of this.  She does not like her nose spray because it smells like bruises.  She is wondering if there is something else she can use.  She is loving her new job.  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 Her husband stays at home with the kids and the pets.  Otherwise, there have been no changes to her past medical history, surgical history, family history, or social history.  Assessment and Plan:  Claudia Price is a 31 y.o. female with:  Perennial allergic rhinitis  Food intolerance - with labs sent in  the past that have been negative   Mild intermittent asthma with acute exacerbation   We are going to start her on a prednisone burst to treat her inflamed lungs.  This should help her cough and allow her to heal appropriately.  We are going to send in Symbicort as well so she can get that back on board.  I had hoped that keeping the Symbicort on board every day would help decrease her exacerbations, she has not had it in quite some time.  Diagnostics: None.  Medication List:  Current Outpatient Medications  Medication Sig  Dispense Refill   predniSONE (DELTASONE) 10 MG tablet Take 3 tabs (30mg ) twice daily for 3 days, then 2 tabs (20mg ) twice daily for 3 days, then 1 tab (10mg ) twice daily for 3 days, then STOP. 36 tablet 0   triamcinolone (NASACORT) 55 MCG/ACT AERO nasal inhaler Place 2 sprays into the nose daily. 1 each 5   albuterol (VENTOLIN HFA) 108 (90 Base) MCG/ACT inhaler INHALE 2 PUFFS BY MOUTH EVERY 4 HOURS AS NEEDED 8.5 each 1   budesonide-formoterol (SYMBICORT) 160-4.5 MCG/ACT inhaler INHALE 2 PUFFS TWO TIMES DAILY WITH SPACER 10.2 each 5   calcium citrate-vitamin D (CALCIUM CITRATE CHEWY BITE) 500-500 MG-UNIT chewable tablet Chew 1 tablet by mouth 3 (three) times daily.     fluticasone (FLONASE) 50 MCG/ACT nasal spray SPRAY 1-2 SPRAYS INTO BOTH NOSTRILS DAILY. 16 mL 3   levocetirizine (XYZAL) 5 MG tablet Take 1 tablet (5 mg total) by mouth every evening. 30 tablet 5   levothyroxine (SYNTHROID) 175 MCG tablet Take 175 mcg by mouth at bedtime.     Multiple Vitamins-Minerals (CELEBRATE MULTI-COMPLETE 45) CHEW Chew 1 tablet by mouth in the morning.     PARAGARD INTRAUTERINE COPPER IUD IUD 1 each by Intrauterine route once.     No current facility-administered medications for this visit.   Allergies: Allergies  Allergen Reactions   Cat Hair Extract Cough, Itching and Shortness Of Breath   Dust Mite Extract Cough, Itching and Shortness Of Breath   Molds & Smuts Cough, Itching, Shortness Of Breath and Swelling   Casein Other (See Comments) and Swelling   Dairy Aid [Tilactase]    Gluten Meal    Soy Allergy    I reviewed her past medical history, social history, family history, and environmental history and no significant changes have been reported from previous visits.  Review of Systems  Constitutional:  Negative for activity change, appetite change, chills, diaphoresis and fatigue.  HENT:  Negative for congestion, postnasal drip, rhinorrhea, sinus pressure, sinus pain, sneezing and sore throat.    Eyes:  Negative for pain, discharge, redness and itching.  Respiratory:  Positive for cough and shortness of breath. Negative for wheezing and stridor.   Gastrointestinal:  Negative for diarrhea, nausea and vomiting.  Endocrine: Negative for cold intolerance and heat intolerance.  Musculoskeletal:  Negative for arthralgias, joint swelling and myalgias.  Skin:  Negative for rash.  Allergic/Immunologic: Negative for environmental allergies and food allergies.   Objective:  Physical exam not obtained as encounter was done via telephone.   Previous notes and tests were reviewed.  I discussed the assessment and treatment plan with the patient. The patient was provided an opportunity to ask questions and all were answered. The patient agreed with the plan and demonstrated an understanding of the instructions.   The patient was advised to call back or seek an in-person evaluation if the symptoms worsen or if the condition fails to  improve as anticipated.  I provided 26 minutes of non-face-to-face time during this encounter.  It was my pleasure to participate in Claudia Price's care today. Please feel free to contact me with any questions or concerns.   Sincerely,  Valentina Shaggy, MD

## 2021-10-31 NOTE — Patient Instructions (Addendum)
1. Allergic rhinitis (dust mites, mold, cat) ?- Continue with Xyzal (levocetirizine) daily as needed. ?- Continue with  your nasal spray 1-2 sprays daily as needed.  ?  ?2. Adverse food reaction  ?- No longer avoiding foods since gastric bypass in July 2022 ?  ?3. Mild intermittent asthma, uncomplicated  ?- Continue Symbicort two puffs twice daily every day until we see you again. ?- Continue with albuterol 2 puffs every 4-6 hours as needed for cough, wheeze, tightness in chest, or shortness of breath.   ? ? ?4. Schedule a follow up appointment in July when you are back in town for other appointments ? ?

## 2021-11-01 ENCOUNTER — Other Ambulatory Visit: Payer: Self-pay

## 2021-11-01 ENCOUNTER — Encounter: Payer: Self-pay | Admitting: Family

## 2021-11-01 ENCOUNTER — Ambulatory Visit (INDEPENDENT_AMBULATORY_CARE_PROVIDER_SITE_OTHER): Payer: 59 | Admitting: Family

## 2021-11-01 VITALS — BP 124/76 | HR 76 | Temp 98.1°F | Resp 18 | Ht 69.0 in | Wt 217.6 lb

## 2021-11-01 DIAGNOSIS — J3089 Other allergic rhinitis: Secondary | ICD-10-CM | POA: Diagnosis not present

## 2021-11-01 DIAGNOSIS — J4521 Mild intermittent asthma with (acute) exacerbation: Secondary | ICD-10-CM | POA: Diagnosis not present

## 2021-11-01 MED ORDER — LEVOCETIRIZINE DIHYDROCHLORIDE 5 MG PO TABS: 5.0000 mg | ORAL_TABLET | Freq: Two times a day (BID) | ORAL | 5 refills | Status: DC | PRN

## 2021-11-01 MED ORDER — ALBUTEROL SULFATE HFA 108 (90 BASE) MCG/ACT IN AERS: 2.0000 | INHALATION_SPRAY | RESPIRATORY_TRACT | 1 refills | Status: AC | PRN

## 2021-11-01 MED ORDER — BUDESONIDE-FORMOTEROL FUMARATE 160-4.5 MCG/ACT IN AERO: INHALATION_SPRAY | RESPIRATORY_TRACT | 5 refills | Status: AC

## 2021-11-01 MED ORDER — TRIAMCINOLONE ACETONIDE 55 MCG/ACT NA AERO: 2.0000 | INHALATION_SPRAY | Freq: Every day | NASAL | 5 refills | Status: DC

## 2021-11-01 NOTE — Progress Notes (Signed)
? ?[address], [address] ?[place] [address] ?Dept: [phone] ? ?FOLLOW UP NOTE ? ?Patient ID: Claudia Price, female    DOB: [birth year]  Age: 31 y.o. MRN: [id number] ?Date of Office Visit: 11/01/2021 ? ?Assessment  ?Chief Complaint: Asthma and Allergic Rhinitis  ? ?HPI ?Claudia Price is a 31 year old female who presents today for follow-up of mild intermittent asthma with acute exacerbation, perennial allergic rhinitis, and food intolerance-with labs in the past and have been negative.  She was last seen on October 16, 2021 by Dr. Dellis Anes.  Since her last office visit she denies any new diagnosis or surgeries.  She does mention that in July 2022 she did have gastric bypass. ? ?Asthma is reported as controlled with Symbicort 2 puffs twice a day with spacer and albuterol as needed.  She reports her breathing is a lot better since finishing the prednisone she was given at the last office visit.  She denies coughing, wheezing, tightness in her chest, shortness of breath, and nocturnal awakenings due to breathing problems.  Since her last office visit she has not required any other steroids than the ones we prescribed.  She has not made any trips to the emergency room or urgent care due to breathing problems.  She has not had to use her albuterol inhaler since stopping the prednisone.  She does mention that she did have some heartburn/reflux symptoms while on the prednisone but her GI doctor did have her take an acid while on the prednisone. ? ?Allergic rhinitis is reported as controlled with levocetirizine 5 mg once a day and another nasal spray that she does not know the name of.  She did not like the smell of fluticasone nasal spray.  She does think the nose spray that she has is a steroid nasal spray.  She denies rhinorrhea, nasal congestion, and postnasal drip.  She has not had any sinus infections since we last saw her. ? ?She mentions that she is not avoiding any foods since having gastric  bypass surgery in July 2022. ? ? ?Drug Allergies:  ?Allergies  ?Allergen Reactions  ? Cat Hair Extract Cough, Itching and Shortness Of Breath  ? Dust Mite Extract Cough, Itching and Shortness Of Breath  ?  Other reaction(s): Cough  ? Molds & Smuts Cough, Itching, Shortness Of Breath and Swelling  ? Casein Other (See Comments) and Swelling  ? Soy Allergy Rash  ? Dairy Aid [Tilactase]   ? Gluten Meal   ? ? ?Review of Systems: ?Review of Systems  ?Constitutional:  Negative for chills and fever.  ?HENT:    ?     Denies rhinorrhea, nasal congestion, and postnasal drip  ?Eyes:   ?     Denies itchy watery eyes  ?Respiratory:  Negative for cough, shortness of breath and wheezing.   ?Cardiovascular:  Negative for chest pain and palpitations.  ?Gastrointestinal:   ?     Denies heartburn or reflux.  She does mention that she had to take an antacid while on the prednisone, but did stop the prednisone was finished  ?Genitourinary:  Negative for frequency.  ?Skin:  Positive for itching.  ?     Reports itchy skin that can be sore at times due to excess skin that she does not dry completely  ?Neurological:  Negative for headaches.  ?Endo/Heme/Allergies:  Positive for environmental allergies.  ? ? ?Physical Exam: ?BP 124/76   Pulse 76   Temp 98.1 ?F (36.7 ?C) (Temporal)   Resp  18   Ht 5\' 9"  (1.753 m)   Wt 217 lb 9.6 oz (98.7 kg)   SpO2 100%   BMI 32.13 kg/m?   ? ?Physical Exam ?Constitutional:   ?   Appearance: Normal appearance.  ?HENT:  ?   Head: Normocephalic and atraumatic.  ?   Comments: Pharynx normal, eyes normal, ears normal, nose normal ?   Right Ear: Tympanic membrane, ear canal and external ear normal.  ?   Left Ear: Tympanic membrane, ear canal and external ear normal.  ?   Nose: Nose normal.  ?   Mouth/Throat:  ?   Mouth: Mucous membranes are moist.  ?   Pharynx: Oropharynx is clear.  ?Eyes:  ?   Conjunctiva/sclera: Conjunctivae normal.  ?Cardiovascular:  ?   Rate and Rhythm: Normal rate and regular rhythm.  ?    Heart sounds: Normal heart sounds.  ?Pulmonary:  ?   Effort: Pulmonary effort is normal.  ?   Breath sounds: Normal breath sounds.  ?   Comments: Lungs clear to auscultation  ?Musculoskeletal:  ?   Cervical back: Neck supple.  ?Skin: ?   General: Skin is warm.  ?Neurological:  ?   Mental Status: She is alert and oriented to person, place, and time.  ?Psychiatric:     ?   Mood and Affect: Mood normal.     ?   Behavior: Behavior normal.     ?   Thought Content: Thought content normal.     ?   Judgment: Judgment normal.  ? ? ?Diagnostics: ?FVC 4.39 L, FEV1 3.54 L (95%).  Predicted FVC 4.49 L, predicted FEV1 3.72 L.  Spirometry indicates normal respiratory function. ? ?Assessment and Plan: ?1. Mild intermittent asthma with acute exacerbation   ?2. Perennial allergic rhinitis   ? ? ?Meds ordered this encounter  ?Medications  ? triamcinolone (NASACORT) 55 MCG/ACT AERO nasal inhaler  ?  Sig: Place 2 sprays into the nose daily.  ?  Dispense:  1 each  ?  Refill:  5  ? albuterol (VENTOLIN HFA) 108 (90 Base) MCG/ACT inhaler  ?  Sig: Inhale 2 puffs into the lungs every 4 (four) hours as needed.  ?  Dispense:  18 g  ?  Refill:  1  ? budesonide-formoterol (SYMBICORT) 160-4.5 MCG/ACT inhaler  ?  Sig: INHALE 2 PUFFS TWO TIMES DAILY WITH SPACER  ?  Dispense:  10.2 each  ?  Refill:  5  ? levocetirizine (XYZAL) 5 MG tablet  ?  Sig: Take 1 tablet (5 mg total) by mouth 2 (two) times daily as needed for allergies (Can take an extra dose during flare ups.).  ?  Dispense:  60 tablet  ?  Refill:  5  ? ? ?Patient Instructions  ?1. Allergic rhinitis (dust mites, mold, cat) ?- Continue with Xyzal (levocetirizine) daily as needed. ?- Continue with  your nasal spray 1-2 sprays daily as needed.  ?  ?2. Adverse food reaction  ?- No longer avoiding foods since gastric bypass in July 2022 ?  ?3. Mild intermittent asthma, uncomplicated  ?- Continue Symbicort two puffs twice daily every day until we see you again. ?- Continue with albuterol 2 puffs  every 4-6 hours as needed for cough, wheeze, tightness in chest, or shortness of breath.   ? ? ?4. Schedule a follow up appointment in July when you are back in town for other appointments ? ? ?Return return in July. ?  ? ?Thank you for the opportunity  to care for this patient.  Please do not hesitate to contact me with questions. ? ?Nehemiah Settle, FNP ?[facility] ? ? ? ? ?

## 2021-12-11 ENCOUNTER — Other Ambulatory Visit: Payer: Self-pay | Admitting: *Deleted

## 2021-12-11 NOTE — Telephone Encounter (Signed)
Patient called and started having some nasal congestion, post nasal drip, and her left ear has been clogged. She stated that these symptoms started last Wednesday. She has taking Phenylephrine 10mg  daily along with Zycam nasal spray 1 spray each nostril 1-2 times daily. She has been taking Claritin daily but doubled it the last 2 days. She tried using an ear spray to see if it would help break up impacted cerumen but there was no resolution. She denies any ear pain or drainage. I advised that you would be back in office and she was fine with you receiving this message tomorrow. She has received some relief from the medications but she states that she states it feels like she only has about 20% hearing in that ear.  ?

## 2021-12-12 MED ORDER — PREDNISONE 20 MG PO TABS: 20.0000 mg | ORAL_TABLET | Freq: Two times a day (BID) | ORAL | 0 refills | Status: AC

## 2021-12-12 NOTE — Telephone Encounter (Signed)
Goodness - let's try some prednisone to see if we can open stuff back up: 20mg  BID for five days. Pended script. Thanks! Please sign on my behalf.  ? ? , MD ?[facility] ? ?

## 2021-12-12 NOTE — Addendum Note (Signed)
Addended by: Alfonse Spruce on: 12/12/2021 06:25 AM ? ? Modules accepted: Orders ? ?

## 2022-02-05 ENCOUNTER — Encounter

## 2022-03-07 NOTE — Progress Notes (Unsigned)
Follow Up Note  RE: Claudia Price MRN: [id number] DOB: [birth year] Date of Office Visit: 03/08/2022  Referring provider: Julianne Handler, NP Primary care provider: Julianne Handler, NP  Chief Complaint: No chief complaint on file.  History of Present Illness: I had the pleasure of seeing Claudia Price for a [facility]  on 03/07/20[facility]. She is a 31 y.o. female, who is being followed for allergic rhinitis, and asthma. Her previous allergy office visit was on 11/01/2021 with Nehemiah Settle FNP. Today is a regular follow up visit.  1. Allergic rhinitis (dust mites, mold, cat) - Continue with Xyzal (levocetirizine) daily as needed. - Continue with  your nasal spray 1-2 sprays daily as needed.    2. 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 Mild intermittent asthma, uncomplicated  - Continue Symbicort two puffs twice daily every day until we see you again. - Continue with albuterol 2 puffs every 4-6 hours as needed for cough, wheeze, tightness in chest, or shortness of breath.     Mild intermittent asthma without complication Past history - Reports wheezing at times with no specific triggers noted.  Used to be on Symbicort in the past. Interim history - Well controlled. Only used albuterol during URI in the winter.  May use albuterol rescue inhaler 2 puffs or nebulizer every 4 to 6 hours as needed for shortness of breath, chest tightness, coughing, and wheezing. May use albuterol rescue inhaler 2 puffs 5 to 15 minutes prior to strenuous physical activities. Monitor frequency of use.  Get spirometry at next visit.    Other allergic rhinitis Past history - Minimal rhino conjunctivitis symptoms for the past 1 year mainly during the fall. Used Flonase and OTC antihistamines with some benefit. 2019 skin testing showed: Positive to dust mites, mold and cat. Interim history - symptoms flare when not at home  and during the fall.  Continue environmental control measures. Try Xyzal (levocetirizine) daily as needed. Minimize using decongestants.  May use fluticasone nasal spray 1-2 sprays daily as needed. If symptoms not controlled then let us know.    Adverse food reaction Past history - Diagnosed with hypothyroidism over 1 year ago and noticed improvement in health with avoiding gluten, dairy and soy. Gluten and dairy caused GI issues and soy caused body aches/joint pains. 2019 skin testing showed: Negative to soy, wheat and dairy. Interim history - avoiding soy, wheat and dairy and doing well.  Continue to avoid soy, wheat and dairy.  Assessment and Plan: Claudia Price is a 31 y.o. female with: No problem-specific Assessment & Plan notes found for this encounter.  No follow-ups on file.  No orders of the defined types were placed in this encounter.  Lab Orders  No laboratory test(s) ordered today    Diagnostics: Spirometry:  Tracings reviewed. Her effort: {Blank single:19197::"Good reproducible efforts.","It was hard to get consistent efforts and there is a question as to whether this reflects a maximal maneuver.","Poor effort, data can not be interpreted."} FVC: ***L FEV1: ***L, ***% predicted FEV1/FVC ratio: ***% Interpretation: {Blank single:19197::"Spirometry consistent with mild obstructive disease","Spirometry consistent with moderate obstructive disease","Spirometry consistent with severe obstructive disease","Spirometry consistent with possible restrictive disease","Spirometry consistent with mixed obstructive and restrictive disease","Spirometry uninterpretable due to technique","Spirometry consistent with normal pattern","No overt abnormalities noted given today's efforts"}.  Please see scanned spirometry results for details.  Skin Testing: {Blank single:19197::"Select foods","Environmental allergy panel","Environmental allergy panel  and select foods","Food allergy  panel","None","Deferred due to recent antihistamines use"}. *** Results discussed with patient/family.   Medication List:  Current Outpatient Medications  Medication Sig Dispense Refill   albuterol (VENTOLIN HFA) 108 (90 Base) MCG/ACT inhaler Inhale 2 puffs into the lungs every 4 (four) hours as needed. 18 g 1   budesonide-formoterol (SYMBICORT) 160-4.5 MCG/ACT inhaler INHALE 2 PUFFS TWO TIMES DAILY WITH SPACER 10.2 each 5   calcium citrate-vitamin D (CALCIUM CITRATE CHEWY BITE) 500-500 MG-UNIT chewable tablet Chew 1 tablet by mouth 3 (three) times daily.     DENTA 5000 PLUS 1.1 % CREA dental cream Take by mouth daily.     levocetirizine (XYZAL) 5 MG tablet Take 1 tablet (5 mg total) by mouth 2 (two) times daily as needed for allergies (Can take an extra dose during flare ups.). 60 tablet 5   levothyroxine (SYNTHROID) 175 MCG tablet Take 175 mcg by mouth at bedtime.     Multiple Vitamins-Minerals (CELEBRATE MULTI-COMPLETE 45) CHEW Chew 1 tablet by mouth in the morning.     PARAGARD INTRAUTERINE COPPER IUD IUD 1 each by Intrauterine route once.     triamcinolone (NASACORT) 55 MCG/ACT AERO nasal inhaler Place 2 sprays into the nose daily. 1 each 5   No current facility-administered medications for this visit.   Allergies: Allergies  Allergen Reactions   Cat Hair Extract Cough, Itching and Shortness Of Breath   Dust Mite Extract Cough, Itching and Shortness Of Breath    Other reaction(s): Cough   Molds & Smuts Cough, Itching, Shortness Of Breath and Swelling   Casein Other (See Comments) and Swelling   Soy Allergy Rash   Dairy Aid [Tilactase]    Gluten Meal    I reviewed her past medical history, social history, family history, and environmental history and no significant changes have been reported from her previous visit.  Review of Systems  Constitutional:  Negative for appetite change, chills, fever and unexpected weight change.  HENT:  Negative for congestion and rhinorrhea.    Eyes:  Negative for itching.  Respiratory:  Negative for cough, chest tightness, shortness of breath and wheezing.   Gastrointestinal:  Negative for abdominal pain.  Skin:  Negative for rash.  Allergic/Immunologic: Positive for environmental allergies.  Neurological:  Negative for headaches.    Objective: There were no vitals taken for this visit. There is no height or weight on file to calculate BMI. Physical Exam Vitals and nursing note reviewed.  Constitutional:      Appearance: Normal appearance. She is well-developed.  HENT:     Head: Normocephalic and atraumatic.     Right Ear: Tympanic membrane and external ear normal.     Left Ear: Tympanic membrane and external ear normal.     Nose: Nose normal.     Mouth/Throat:     Mouth: Mucous membranes are moist.     Pharynx: Oropharynx is clear.  Eyes:     Conjunctiva/sclera: Conjunctivae normal.  Cardiovascular:     Rate and Rhythm: Normal rate and regular rhythm.     Heart sounds: Normal heart sounds. No murmur heard. Pulmonary:     Effort: Pulmonary effort is normal.     Breath sounds: Normal breath sounds. No wheezing, rhonchi or rales.  Musculoskeletal:     Cervical back: Neck supple.  Skin:    General: Skin is warm.     Findings: No rash.  Neurological:     Mental Status: She is alert and oriented to person, place, and  time.  Psychiatric:        Behavior: Behavior normal.    Previous notes and tests were reviewed. The plan was reviewed with the patient/family, and all questions/concerned were addressed.  It was my pleasure to see Claudia Price today and participate in her care. Please feel free to contact me with any questions or concerns.  Sincerely,  Wyline Mood, DO Allergy & Immunology  [facility] office: [phone] [facility] office: [phone]

## 2022-03-08 ENCOUNTER — Other Ambulatory Visit: Payer: Self-pay

## 2022-03-08 ENCOUNTER — Encounter: Payer: Self-pay | Admitting: Skilled Nursing Facility1

## 2022-03-08 ENCOUNTER — Encounter: Payer: BC Managed Care – PPO | Attending: General Surgery | Admitting: Skilled Nursing Facility1

## 2022-03-08 ENCOUNTER — Encounter: Payer: Self-pay | Admitting: Allergy

## 2022-03-08 ENCOUNTER — Ambulatory Visit (INDEPENDENT_AMBULATORY_CARE_PROVIDER_SITE_OTHER): Payer: 59 | Admitting: Allergy

## 2022-03-08 VITALS — BP 122/80 | HR 89 | Temp 97.9°F | Resp 20 | Ht 68.0 in | Wt 215.4 lb

## 2022-03-08 DIAGNOSIS — Z713 Dietary counseling and surveillance: Secondary | ICD-10-CM | POA: Insufficient documentation

## 2022-03-08 DIAGNOSIS — J3089 Other allergic rhinitis: Secondary | ICD-10-CM

## 2022-03-08 DIAGNOSIS — J4530 Mild persistent asthma, uncomplicated: Secondary | ICD-10-CM | POA: Diagnosis not present

## 2022-03-08 DIAGNOSIS — J4521 Mild intermittent asthma with (acute) exacerbation: Secondary | ICD-10-CM

## 2022-03-08 DIAGNOSIS — E669 Obesity, unspecified: Secondary | ICD-10-CM

## 2022-03-08 MED ORDER — RYALTRIS 665-25 MCG/ACT NA SUSP: 1.0000 | Freq: Two times a day (BID) | NASAL | 5 refills | Status: DC

## 2022-03-08 MED ORDER — LEVOCETIRIZINE DIHYDROCHLORIDE 5 MG PO TABS: 5.0000 mg | ORAL_TABLET | Freq: Every evening | ORAL | 2 refills | Status: DC

## 2022-03-08 NOTE — Assessment & Plan Note (Addendum)
Past history - 2019 skin testing was positive to mold, dust mites and cat. Interim history - controlled with some drainage. . Continue environmental control measures as below.  . Use over the counter antihistamines such as Zyrtec (cetirizine), Claritin (loratadine), Allegra (fexofenadine), or Xyzal (levocetirizine) daily as needed. May take twice a day during allergy flares. May switch antihistamines every few months. . Start Ryaltris (olopatadine + mometasone nasal spray combination) 1-2 sprays per nostril twice a day. Sample given. o This replaces your other nasal sprays. o If this works well for you, then have [facility] ship the medication to your home - prescription already sent in.

## 2022-03-08 NOTE — Patient Instructions (Addendum)
1. Allergic rhinitis (dust mites, mold, cat) Continue environmental control measures as below.  Use over the counter antihistamines such as Zyrtec (cetirizine), Claritin (loratadine), Allegra (fexofenadine), or Xyzal (levocetirizine) daily as needed. May take twice a day during allergy flares. May switch antihistamines every few months. Start Ryaltris (olopatadine + mometasone nasal spray combination) 1-2 sprays per nostril twice a day. Sample given. This replaces your other nasal sprays. If this works well for you, then have [facility] ship the medication to your home - prescription already sent in.   2. Mild intermittent asthma Lung testing was normal today. Daily controller medication(s): START Symbicort 2 puffs twice a day with spacer and rinse mouth afterwards starting from January through March.  During upper respiratory infections/flares:  Start Symbicort 2 puffs twice a day with spacer and rinse mouth afterwards for 1-2 weeks until your breathing symptoms return to baseline.  Pretreat with albuterol 2 puffs. May use albuterol rescue inhaler 2 puffs every 4 to 6 hours as needed for shortness of breath, chest tightness, coughing, and wheezing. Monitor frequency of use.  Asthma control goals:  Full participation in all desired activities (may need albuterol before activity) Albuterol use two times or less a week on average (not counting use with activity) Cough interfering with sleep two times or less a month Oral steroids no more than once a year No hospitalizations   Follow up in 8 months or sooner if needed.  Let us know if you have issues in January/February and we can schedule a televisit because you live in [place].   Control of House Dust Mite Allergen Dust mite allergens are a common trigger of allergy and asthma symptoms. While they can be found throughout the house, these microscopic creatures thrive in warm, humid environments such as bedding, upholstered  furniture and carpeting. Because so much time is spent in the bedroom, it is essential to reduce mite levels there.  Encase pillows, mattresses, and box springs in special allergen-proof fabric covers or airtight, zippered plastic covers.  Bedding should be washed weekly in hot water (130 F) and dried in a hot dryer. Allergen-proof covers are available for comforters and pillows that can't be regularly washed.  Wash the allergy-proof covers every few months. Minimize clutter in the bedroom. Keep pets out of the bedroom.  Keep humidity less than 50% by using a dehumidifier or air conditioning. You can buy a humidity measuring device called a hygrometer to monitor this.  If possible, replace carpets with hardwood, linoleum, or washable area rugs. If that's not possible, vacuum frequently with a vacuum that has a HEPA filter. Remove all upholstered furniture and non-washable window drapes from the bedroom. Remove all non-washable stuffed toys from the bedroom.  Wash stuffed toys weekly. Pet Allergen Avoidance: Contrary to popular opinion, there are no "hypoallergenic" breeds of dogs or cats. That is because people are not allergic to an animal's hair, but to an allergen found in the animal's saliva, dander (dead skin flakes) or urine. Pet allergy symptoms typically occur within minutes. For some people, symptoms can build up and become most severe 8 to 12 hours after contact with the animal. People with severe allergies can experience reactions in public places if dander has been transported on the pet owners' clothing. Keeping an animal outdoors is only a partial solution, since homes with pets in the yard still have higher concentrations of animal allergens. Before getting a pet, ask your allergist to determine if you are allergic to animals. If  your pet is already considered part of your family, try to minimize contact and keep the pet out of the bedroom and other rooms where you spend a great deal of  time. As with dust mites, vacuum carpets often or replace carpet with a hardwood floor, tile or linoleum. High-efficiency particulate air (HEPA) cleaners can reduce allergen levels over time. While dander and saliva are the source of cat and dog allergens, urine is the source of allergens from rabbits, hamsters, mice and [place] pigs; so ask a non-allergic family member to clean the animal's cage. If you have a pet allergy, talk to your allergist about the potential for allergy immunotherapy (allergy shots). This strategy can often provide long-term relief. Mold Control Mold and fungi can grow on a variety of surfaces provided certain temperature and moisture conditions exist.  Outdoor molds grow on plants, decaying vegetation and soil. The major outdoor mold, Alternaria and Cladosporium, are found in very high numbers during hot and dry conditions. Generally, a late summer - fall peak is seen for common outdoor fungal spores. Rain will temporarily lower outdoor mold spore count, but counts rise rapidly when the rainy period ends. The most important indoor molds are Aspergillus and Penicillium. Dark, humid and poorly ventilated basements are ideal sites for mold growth. The next most common sites of mold growth are the bathroom and the kitchen. Outdoor (Seasonal) Mold Control Use air conditioning and keep windows closed. Avoid exposure to decaying vegetation. Avoid leaf raking. Avoid grain handling. Consider wearing a face mask if working in moldy areas.  Indoor (Perennial) Mold Control  Maintain humidity below 50%. Get rid of mold growth on hard surfaces with water, detergent and, if necessary, 5% bleach (do not mix with other cleaners). Then dry the area completely. If mold covers an area more than 10 square feet, consider hiring an indoor environmental professional. For clothing, washing with soap and water is best. If moldy items cannot be cleaned and dried, throw them away. Remove sources e.g.  contaminated carpets. Repair and seal leaking roofs or pipes. Using dehumidifiers in damp basements may be helpful, but empty the water and clean units regularly to prevent mildew from forming. All rooms, especially basements, bathrooms and kitchens, require ventilation and cleaning to deter mold and mildew growth. Avoid carpeting on concrete or damp floors, and storing items in damp areas.

## 2022-03-08 NOTE — Assessment & Plan Note (Addendum)
Last prednisone use was in March - had some bone pain when weaning off. Stopped Symbicort then. Usually flares in February.  Today's spirometry was normal. . Daily controller medication(s): START Symbicort 2 puffs twice a day with spacer and rinse mouth afterwards starting from January through March.  . During upper respiratory infections/flares:  . Start Symbicort 2 puffs twice a day with spacer and rinse mouth afterwards for 1-2 weeks until your breathing symptoms return to baseline.  . Pretreat with albuterol 2 puffs. . May use albuterol rescue inhaler 2 puffs every 4 to 6 hours as needed for shortness of breath, chest tightness, coughing, and wheezing. Monitor frequency of use.  . Get spirometry at next visit.  If symptoms flare in January/February advised patient to schedule a televisit as she lives in [place] currently.

## 2022-03-08 NOTE — Progress Notes (Signed)
Bariatric Nutrition Follow-Up Visit Medical Nutrition Therapy   NUTRITION ASSESSMENT    Surgery date: 03/21/2021 Surgery type: RYGB Start weight at [facility]: 288.8 Weight today: 214.8 pounds   Body Composition Scale 04/04/2021 05/10/2021 07/05/2021 10/11/2021 03/08/2022  Current Body Weight 259 241.6 218.6 208.6 214.8  Total Body Fat % 42.1 39.6 38 36.2 36.6  Visceral Fat 11 9 9 8 8   Fat-Free Mass % 57.8 60.3 61.9 63.7 63.3   Total Body Water % 43.4 44.6 45.4 46.3 46.1  Muscle-Mass lbs 37 37.5 35.6 35.8 36.5  BMI 38 34.6 33.1 31.3 31.5  Body Fat Displacement              Torso  lbs 67.6 59.4 51.4 46.8 48.7         Left Leg  lbs 13.5 11.8 10.2 9.3 9.7         Right Leg  lbs 13.5 11.8 10.2 9.3 9.7         Left Arm  lbs 6.7 5.9 5.1 4.6 4.8         Right Arm   lbs 6.7 5.9 5.1 4.6 4.8   Clinical  Medical hx: Anemia Medications: Thyroid Labs:  Notable signs/symptoms: intolerance to soy, dairy, wheat (feeling puffy or bloated)-no longer having issues with these Any previous deficiencies? 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 Pt states her father was diagnosed with cancer.   Pt and dietitian discussed the reduction of overall stress due to her previous work experience stating that will most likely reduce stress eating and therefore her weight.    Estimated daily fluid intake: 64+ oz Estimated daily protein intake: 65+ g Supplements: multi and calcium Current average weekly physical activity: lost a bit a consistency recently   24-Hr Dietary Recall First Meal: coffee + fairlife milk and protein shake Snack:   Second Meal: chicken wraps + some potato Snack:  cheese stick Third Meal: hamburger + potatoes +  Snack:  Beverages: water, coffee  Post-Op Goals/ Signs/ Symptoms Using straws: no Drinking while eating: no Chewing/swallowing difficulties: no Changes in vision: no Changes to  mood/headaches: no Hair loss/changes to skin/nails: no Difficulty focusing/concentrating: no Sweating: no Dizziness/lightheadedness: no Palpitations: no Carbonated/caffeinated beverages: no N/V/D/C/Gas: some constipation  Abdominal pain: no Dumping syndrome: no    NUTRITION DIAGNOSIS  Overweight/obesity ([place]-3.3) related to past poor dietary habits and physical inactivity as evidenced by completed bariatric surgery and following dietary guidelines for continued weight loss and healthy nutrition status.     NUTRITION INTERVENTION: continued  Nutrition counseling (C-1) and education (E-2) to facilitate bariatric surgery goals, including: The importance of consuming adequate calories as well as certain nutrients daily due to the body's need for essential vitamins, minerals, and fats The importance of daily physical activity and to reach a goal of at least 150 minutes of moderate to vigorous physical activity weekly (or as directed by their physician) due to benefits such as increased musculature and improved lab values The importance of intuitive eating specifically learning hunger-satiety cues and understanding the importance of learning a new body: The importance of mindful eating to avoid grazing behaviors  Importance of vegetables To have an overall healthy diet, adult men and women are recommended to consume anywhere from 2-3 cups of vegetables daily. Vegetables provide a wide range of vitamins and minerals such as vitamin A, vitamin C, potassium, and folic acid. According to the  World Science writer, including fruit and vegetables daily may reduce the risk of cardiovascular disease, certain cancers, and other non-communicable diseases. Encouraged patient to honor their body's internal hunger and fullness cues.  Throughout the day, check in mentally and rate hunger. Stop eating when satisfied not full regardless of how much food is left on the plate.  Get more if still hungry 20-30  minutes later.  The key is to honor satisfaction so throughout the meal, rate fullness factor and stop when comfortably satisfied not physically full. The key is to honor hunger and fullness without any feelings of guilt or shame.  Pay attention to what the internal cues are, rather than any external factors. This will enhance the confidence you have in listening to your own body and following those internal cues enabling you to increase how often you eat when you are hungry not out of appetite and stop when you are satisfied not full.  Encouraged pt to continue to eat balanced meals inclusive of non starchy vegetables 2 times a day 7 days a week Encouraged pt to choose lean protein sources: limiting beef, pork, sausage, hotdogs, and lunch meat Encourage pt to choose healthy fats such as plant based limiting animal fats Encouraged pt to continue to drink a minium 64 fluid ounces with half being plain water to satisfy proper hydration    Handouts Previously Provided Include  Meal ideas sheet  Learning Style & Readiness for Change Teaching method utilized: Visual & Auditory  Demonstrated degree of understanding via: Teach Back  Readiness Level: Action Barriers to learning/adherence to lifestyle change: none   RD's Notes for Next Visit Assess adherence to pt chosen goals    MONITORING & EVALUATION Dietary intake, weekly physical activity, body weight  Next Steps Patient is to follow-up in 6 months

## 2022-06-01 ENCOUNTER — Telehealth: Payer: Self-pay | Admitting: Allergy & Immunology

## 2022-06-01 DIAGNOSIS — E039 Hypothyroidism, unspecified: Secondary | ICD-10-CM

## 2022-06-01 NOTE — Telephone Encounter (Signed)
Patient requested thyroid studies since she has not established care with a PCP in [place] yet. Printed labs. We will mail to the patient.   [name], MD [facility] [place] [facility] [place]

## 2022-10-15 ENCOUNTER — Encounter (HOSPITAL_COMMUNITY): Payer: Self-pay | Admitting: *Deleted

## 2022-11-07 ENCOUNTER — Ambulatory Visit: Payer: 59 | Admitting: Allergy

## 2022-11-20 ENCOUNTER — Encounter: Payer: BC Managed Care – PPO | Attending: General Surgery | Admitting: Skilled Nursing Facility1

## 2022-11-20 ENCOUNTER — Encounter: Payer: Self-pay | Admitting: Skilled Nursing Facility1

## 2022-11-20 VITALS — Ht 69.5 in | Wt 211.8 lb

## 2022-11-20 DIAGNOSIS — Z9884 Bariatric surgery status: Secondary | ICD-10-CM | POA: Insufficient documentation

## 2022-11-20 DIAGNOSIS — E669 Obesity, unspecified: Secondary | ICD-10-CM | POA: Diagnosis not present

## 2022-11-20 DIAGNOSIS — Z713 Dietary counseling and surveillance: Secondary | ICD-10-CM | POA: Diagnosis not present

## 2022-11-20 NOTE — Progress Notes (Signed)
Bariatric Nutrition Follow-Up Visit Medical Nutrition Therapy   NUTRITION ASSESSMENT    Surgery date: 03/21/2021 Surgery type: RYGB Start weight at [facility]: 288.8 Weight today: 211.8 pounds   Body Composition Scale 04/04/2021 05/10/2021 07/05/2021 10/11/2021 03/08/2022 11/20/2022  Current Body Weight 259 241.6 218.6 208.6 214.8 211.8  Total Body Fat % 42.1 39.6 38 36.2 36.6 35.9  Visceral Fat 11 9 9 8 8 8   Fat-Free Mass % 57.8 60.3 61.9 63.7 63.3 64   Total Body Water % 43.4 44.6 45.4 46.3 46.1 46.5  Muscle-Mass lbs 37 37.5 35.6 35.8 36.5 36.7  BMI 38 34.6 33.1 31.3 31.5 30.7  Body Fat Displacement               Torso  lbs 67.6 59.4 51.4 46.8 48.7 47.1         Left Leg  lbs 13.5 11.8 10.2 9.3 9.7 9.4         Right Leg  lbs 13.5 11.8 10.2 9.3 9.7 9.4         Left Arm  lbs 6.7 5.9 5.1 4.6 4.8 4.7         Right Arm   lbs 6.7 5.9 5.1 4.6 4.8 4.7   Clinical  Medical hx: Anemia Medications: Thyroid Labs:  Notable signs/symptoms: intolerance to soy, dairy, wheat (feeling puffy or bloated)-no longer having issues with these Any previous deficiencies? Iron   Lifestyle & Dietary Hx  Lives in [place] states her new job is going well and has healthier snacks as well.  Pt states her new complex does not have ane elevator using 4 flights of stairs.  Pt states she has generally been moving more. Pt states er 8 year old son wants to worout more so she will start with him.  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  Pt states she has been getting abck into the hunger and fullness cues.   Pt returns doing very well.   Estimated daily fluid intake: 64+ oz Estimated daily protein intake: 65+ g Supplements: multi and calcium Current average weekly physical activity: lunch walks   24-Hr Dietary Recall First Meal: coffee + fairlife milk and protein shake Snack:  black gram bean chip (fiber and protein) Second Meal: chicken wraps + some potato Snack:  cheese stick Third Meal:  hamburger + potatoes  Snack:  Beverages: water, coffee  Post-Op Goals/ Signs/ Symptoms Using straws: no Drinking while eating: no Chewing/swallowing difficulties: no Changes in vision: no Changes to mood/headaches: no Hair loss/changes to skin/nails: no Difficulty focusing/concentrating: no Sweating: no Dizziness/lightheadedness: no Palpitations: no Carbonated/caffeinated beverages: no N/V/D/C/Gas: some constipation  Abdominal pain: no Dumping syndrome: no    NUTRITION DIAGNOSIS  Overweight/obesity ([place]-3.3) related to past poor dietary habits and physical inactivity as evidenced by completed bariatric surgery and following dietary guidelines for continued weight loss and healthy nutrition status.     NUTRITION INTERVENTION: continued  Nutrition counseling (C-1) and education (E-2) to facilitate bariatric surgery goals, including: The importance of consuming adequate calories as well as certain nutrients daily due to the body's need for essential vitamins, minerals, and fats The importance of daily physical activity and to reach a goal of at least 150 minutes of moderate to vigorous physical activity weekly (or as directed by their physician) due to benefits such as increased musculature and improved lab values The importance of intuitive eating specifically learning hunger-satiety cues and understanding the importance of learning a new body: The importance of mindful eating to  avoid grazing behaviors  Importance of vegetables To have an overall healthy diet, adult men and women are recommended to consume anywhere from 2-3 cups of vegetables daily. Vegetables provide a wide range of vitamins and minerals such as vitamin A, vitamin C, potassium, and folic acid. According to the [employer], including fruit and vegetables daily may reduce the risk of cardiovascular disease, certain cancers, and other non-communicable diseases. Encouraged patient to honor their body's  internal hunger and fullness cues.  Throughout the day, check in mentally and rate hunger. Stop eating when satisfied not full regardless of how much food is left on the plate.  Get more if still hungry 20-30 minutes later.  The key is to honor satisfaction so throughout the meal, rate fullness factor and stop when comfortably satisfied not physically full. The key is to honor hunger and fullness without any feelings of guilt or shame.  Pay attention to what the internal cues are, rather than any external factors. This will enhance the confidence you have in listening to your own body and following those internal cues enabling you to increase how often you eat when you are hungry not out of appetite and stop when you are satisfied not full.  Encouraged pt to continue to eat balanced meals inclusive of non starchy vegetables 2 times a day 7 days a week Encouraged pt to choose lean protein sources: limiting beef, pork, sausage, hotdogs, and lunch meat Encourage pt to choose healthy fats such as plant based limiting animal fats Encouraged pt to continue to drink a minium 64 fluid ounces with half being plain water to satisfy proper hydration    Handouts Previously Provided Include  Meal ideas sheet  Learning Style & Readiness for Change Teaching method utilized: Visual & Auditory  Demonstrated degree of understanding via: Teach Back  Readiness Level: Action Barriers to learning/adherence to lifestyle change: none   RD's Notes for Next Visit Assess adherence to pt chosen goals    MONITORING & EVALUATION Dietary intake, weekly physical activity, body weight  Next Steps Patient is to follow-up as needed; please call or email with any future questions or concerns

## 2022-11-22 ENCOUNTER — Encounter: Payer: Self-pay | Admitting: Allergy & Immunology

## 2022-11-22 ENCOUNTER — Other Ambulatory Visit: Payer: Self-pay

## 2022-11-22 ENCOUNTER — Ambulatory Visit (INDEPENDENT_AMBULATORY_CARE_PROVIDER_SITE_OTHER): Payer: BC Managed Care – PPO | Admitting: Allergy & Immunology

## 2022-11-22 VITALS — BP 118/74 | HR 85 | Temp 97.7°F | Resp 12 | Wt 207.9 lb

## 2022-11-22 DIAGNOSIS — J4520 Mild intermittent asthma, uncomplicated: Secondary | ICD-10-CM | POA: Diagnosis not present

## 2022-11-22 DIAGNOSIS — J4521 Mild intermittent asthma with (acute) exacerbation: Secondary | ICD-10-CM

## 2022-11-22 DIAGNOSIS — K9049 Malabsorption due to intolerance, not elsewhere classified: Secondary | ICD-10-CM

## 2022-11-22 DIAGNOSIS — J3089 Other allergic rhinitis: Secondary | ICD-10-CM

## 2022-11-22 MED ORDER — LEVOCETIRIZINE DIHYDROCHLORIDE 5 MG PO TABS
5.0000 mg | ORAL_TABLET | Freq: Two times a day (BID) | ORAL | 3 refills | Status: DC
Start: 2022-11-22 — End: 2022-11-26

## 2022-11-22 MED ORDER — AIRSUPRA 90-80 MCG/ACT IN AERO: 2.0000 | INHALATION_SPRAY | RESPIRATORY_TRACT | 5 refills | Status: AC | PRN

## 2022-11-22 MED ORDER — RYALTRIS 665-25 MCG/ACT NA SUSP: 2.0000 | Freq: Two times a day (BID) | NASAL | 11 refills | Status: AC | PRN

## 2022-11-22 NOTE — Patient Instructions (Addendum)
1. Allergic rhinitis (dust mites, mold, cat) - Continue environmental control measures as below.  - Continue with Xyzal (levocetirizine) 5 mg up to twice daily as needed.  - Continue Ryaltris (olopatadine + mometasone nasal spray combination) 1-2 sprays per nostril twice a day. Sample given. This replaces your other nasal sprays. If this works well for you, then have [facility] ship the medication to your home - prescription already sent in.   2. Mild intermittent asthma - Lung testing looked amazing.  - You seem to have a good handle on your symptoms. - Sample of AirSupra provided (albuterol combined with an inhaled steroid). - Copay card provided.   - Daily controller medication(s): NOTHING - During upper respiratory infections/flares:  Start Symbicort 118mcg 2 puffs twice a day with spacer and rinse mouth afterwards for 1-2 weeks until your breathing symptoms return to baseline.  Pretreat with AirSupra 2 puffs - May use AirSupra rescue inhaler 2 puffs every 4 to 6 hours as needed for shortness of breath, chest tightness, coughing, and wheezing. Monitor frequency of use.  - Asthma control goals:  Full participation in all desired activities (may need albuterol before activity) Albuterol use two times or less a week on average (not counting use with activity) Cough interfering with sleep two times or less a month Oral steroids no more than once a year No hospitalizations   3. Return in about 6 months (around 05/25/2023).   Please inform us of any Emergency Department visits, hospitalizations, or changes in symptoms. Call us before going to the ED for breathing or allergy symptoms since we might be able to fit you in for a sick visit. Feel free to contact us anytime with any questions, problems, or concerns.  It was a pleasure to see you again today!  Websites that have reliable patient information: 1. American Academy of Asthma, Allergy, and Immunology: www.aaaai.org 2. Food Allergy  Research and Education (FARE): foodallergy.org 3. Mothers of Asthmatics: http://www.asthmacommunitynetwork.org 4. American College of Allergy, Asthma, and Immunology: www.acaai.org   COVID-19 Vaccine Information can be found at: ShippingScam.co.uk For questions related to vaccine distribution or appointments, please email [email or web address] or call [phone].   We realize that you might be concerned about having an allergic reaction to the COVID19 vaccines. To help with that concern, WE ARE OFFERING THE COVID19 VACCINES IN OUR OFFICE! Ask the front desk for dates!     "Like" [name] on Facebook and Instagram for our latest updates!      A healthy democracy works best when [employer] participate! Make sure you are registered to vote! If you have moved or changed any of your contact information, you will need to get this updated before voting!  In some cases, you MAY be able to register to vote online: [email or web address]

## 2022-11-22 NOTE — Progress Notes (Signed)
FOLLOW UP  Date of Service/Encounter:  11/22/22   Assessment:   Perennial allergic rhinitis (indoor molds, dust mite, cat) - with current flare since she is staying with her father   Food intolerance - improved since her bariatric surgery   Mild intermittent asthma, uncomplicated  Plan/Recommendations:   1. Allergic rhinitis (dust mites, mold, cat) - Continue environmental control measures as below.  - Continue with Xyzal (levocetirizine) 5 mg up to twice daily as needed.  - Continue Ryaltris (olopatadine + mometasone nasal spray combination) 1-2 sprays per nostril twice a day. Sample given. This replaces your other nasal sprays. If this works well for you, then have [facility] ship the medication to your home - prescription already sent in.   2. Mild intermittent asthma - Lung testing looked amazing.  - You seem to have a good handle on your symptoms. - Sample of AirSupra provided (albuterol combined with an inhaled steroid). - Copay card provided.   - Daily controller medication(s): NOTHING - During upper respiratory infections/flares:  Start Symbicort 190mcg 2 puffs twice a day with spacer and rinse mouth afterwards for 1-2 weeks until your breathing symptoms return to baseline.  Pretreat with AirSupra 2 puffs - May use AirSupra rescue inhaler 2 puffs every 4 to 6 hours as needed for shortness of breath, chest tightness, coughing, and wheezing. Monitor frequency of use.  - Asthma control goals:  Full participation in all desired activities (may need albuterol before activity) Albuterol use two times or less a week on average (not counting use with activity) Cough interfering with sleep two times or less a month Oral steroids no more than once a year No hospitalizations   3. Return in about 6 months (around 05/25/2023).   Subjective:   TYRONZA KITTELL is a 32 y.o. female presenting today for follow up of  Chief Complaint  Patient presents with   Follow-up     ALYZEA RADON has a history of the following: Patient Active Problem List   Diagnosis Date Noted   Mild persistent asthma without complication 99991111   Morbid (severe) obesity due to excess calories ([place]) 03/21/2021   Perennial allergic rhinitis 07/02/2018   Vaginal birth after cesarean (VBAC) 01/11/2015   [redacted] weeks gestation of pregnancy    Maternal morbid obesity, antepartum ([place])    [redacted] weeks gestation of pregnancy    Cervical insufficiency during pregnancy in second trimester, antepartum    Maternal morbid obesity in second trimester, antepartum ([place])    Evaluate anatomy not seen on prior sonogram    Obesity affecting pregnancy in second trimester, antepartum    [redacted] weeks gestation of pregnancy    [redacted] weeks gestation of pregnancy    Cervical insufficiency during pregnancy, antepartum    History of pregnancy loss in prior pregnancy, currently pregnant    Pregnant state, incidental    Cervical incompetence, antepartum condition or complication    Examination to determine fetal viability of pregnancy    Cervical cerclage suture present    Right leg swelling 02/14/2014   Leg pain, right 02/14/2014    History obtained from: chart review and patient.  Rhylin is a 32 y.o. female presenting for a follow up visit.  She was last seen in July 2023.  At that time, she was continued on Xyzal as well as Flonase.  She was started on Ryaltris instead.  Her asthma was under good control with Symbicort as needed.  Since last visit, she has done well.   Asthma/Respiratory  Symptom History: She was doing well until she came down here and slept at her Dad's house.  She has not been using her Symbicort during the winter. She is now living in [place] and her symptoms overall appear much better controlled at this point in time. She has not been on prednisone and has not been using her albuterol much at all. She has not been to the ED and has not been hospitalized for her symptoms.   Allergic  Rhinitis Symptom History: She has not been using the levocetirizine routinely. She has been worse when she is at her dad's home.  She sometimes needs levocetirizine twice daily. She should be using a nose spray, but she is not into using them. Her husband seems to use more of it than she does.   Food Allergy Symptom History: Food allergy issues improved following the bariatric surgery in July 2022. She has done very well with that. She had her one year follow up.  She has been following the recommended treatments including portion sizes and avoidance of certain foods.   She is now working for a [employer], drilling. Previously, she was working for [employer]. 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 Therefore, she just went ahead and left.   Otherwise, there have been no changes to her past medical history, surgical history, family history, or social history.    Review of Systems  Constitutional: Negative.  Negative for chills, fever, malaise/fatigue and weight loss.  HENT:  Positive for congestion. Negative for ear discharge, ear pain and sinus pain.        Positive for postnasal drip.   Eyes:  Negative for pain, discharge and redness.  Respiratory:  Positive for cough. Negative for sputum production, shortness of breath and wheezing.   Cardiovascular: Negative.  Negative for chest pain and palpitations.  Gastrointestinal:  Negative for abdominal pain, constipation, diarrhea, heartburn, nausea and vomiting.  Skin: Negative.  Negative for itching and rash.  Neurological:  Negative for dizziness and headaches.  Endo/Heme/Allergies:  Positive for environmental allergies. Does not bruise/bleed easily.       Objective:   Blood pressure 118/74, pulse 85, temperature 97.7 F (36.5 C), temperature source Temporal, resp. rate 12, weight 207 lb 14.4 oz (94.3 kg), SpO2 98 %, unknown if  currently breastfeeding. Body mass index is 30.26 kg/m.    Physical Exam Vitals reviewed.  Constitutional:      Appearance: She is well-developed.  HENT:     Head: Normocephalic and atraumatic.     Right Ear: Tympanic membrane, ear canal and external ear normal.     Left Ear: Tympanic membrane, ear canal and external ear normal.     Nose: No nasal deformity, septal deviation, mucosal edema or rhinorrhea.     Right Turbinates: Enlarged, swollen and pale.     Left Turbinates: Enlarged, swollen and pale.     Right Sinus: No maxillary sinus tenderness or frontal sinus tenderness.     Left Sinus: No maxillary sinus tenderness or frontal sinus tenderness.     Comments: No sinus tenderness.  No purulent discharge.    Mouth/Throat:     Mouth: Mucous membranes are not pale and not dry.     Pharynx: Uvula midline.  Eyes:     General:        Right eye: No discharge.        Left eye: No  discharge.     Conjunctiva/sclera: Conjunctivae normal.     Right eye: Right conjunctiva is not injected. No chemosis.    Left eye: Left conjunctiva is not injected. No chemosis.    Pupils: Pupils are equal, round, and reactive to light.  Cardiovascular:     Rate and Rhythm: Normal rate and regular rhythm.     Heart sounds: Normal heart sounds.  Pulmonary:     Effort: Pulmonary effort is normal. No tachypnea, accessory muscle usage or respiratory distress.     Breath sounds: Normal breath sounds. No wheezing, rhonchi or rales.     Comments: Decreased air movement at the bases.  Faint wheezing heard throughout. Chest:     Chest wall: No tenderness.  Musculoskeletal:     Cervical back: No tenderness.  Lymphadenopathy:     Cervical: No cervical adenopathy.  Skin:    Coloration: Skin is not pale.     Findings: No abrasion, erythema, petechiae or rash. Rash is not papular, urticarial or vesicular.  Neurological:     Mental Status: She is alert.  Psychiatric:        Behavior: Behavior is cooperative.       Diagnostic studies:    Spirometry: results normal (FEV1: 3.83/104%, FVC: 4.78107%, FEV1/FVC: 80%).    Spirometry consistent with normal pattern.   Allergy Studies: none        [name], MD  [facility] [place] [facility] [place]

## 2022-11-23 ENCOUNTER — Telehealth: Payer: Self-pay

## 2022-11-23 ENCOUNTER — Encounter: Payer: Self-pay | Admitting: Allergy & Immunology

## 2022-11-23 NOTE — Telephone Encounter (Signed)
PA request received via CMM for Levocetirizine Dihydrochloride 5MG  tablets  PA has been submitted to [facility] and is pending determination for a quantity limit exceeded.  Key: [id number]

## 2022-11-23 NOTE — Telephone Encounter (Signed)
Received from insurance. Full document attached in patients file.

## 2022-11-26 MED ORDER — LEVOCETIRIZINE DIHYDROCHLORIDE 5 MG PO TABS: 5.0000 mg | ORAL_TABLET | Freq: Every day | ORAL | 1 refills | Status: DC

## 2022-11-26 NOTE — Telephone Encounter (Signed)
PA has been DENIED, insurance will only cover a max of one tablet daily.

## 2022-11-26 NOTE — Telephone Encounter (Signed)
Called and spoke to patient and informed her of the denial and the approval of the monthly supply. I did assist patient to purchase on amazon to get the second dose suggested. Patient verbalized understanding and agreed to pick up medication and [facility] shipment.

## 2022-11-26 NOTE — Addendum Note (Signed)
Addended by: Clovis Cao A on: 11/26/2022 04:46 PM   Modules accepted: Orders

## 2022-11-26 NOTE — Telephone Encounter (Signed)
I called the patient to inform her of the denial. I left a message for the patient to call the office back.

## 2023-03-01 ENCOUNTER — Ambulatory Visit: Admit: 2023-03-01 | Discharge: 2023-03-01 | Payer: BLUE CROSS/BLUE SHIELD | Primary: Family Medicine

## 2023-03-01 ENCOUNTER — Ambulatory Visit
Admit: 2023-03-01 | Discharge: 2023-03-01 | Payer: BLUE CROSS/BLUE SHIELD | Attending: Family Medicine | Primary: Family Medicine

## 2023-03-01 DIAGNOSIS — E039 Hypothyroidism, unspecified: Secondary | ICD-10-CM

## 2023-03-01 DIAGNOSIS — E559 Vitamin D deficiency, unspecified: Secondary | ICD-10-CM

## 2023-03-01 NOTE — Progress Notes (Signed)
Identified pt with two pt identifiers(name and DOB).    Chief Complaint   Patient presents with    New Patient     Patient is here to establish care, no recent prior PCP      Thyroid Problem     Patient would like to have labs checked for thyroid and get on the correct dosage of medications     Anemia     Patient has anemia and is scheduled to see [facility] hematologist         Health Maintenance Due   Topic    Hepatitis B vaccine (1 of 3 - 3-dose series)    COVID-19 Vaccine (1)    Varicella vaccine (1 of 2 - 2-dose childhood series)    Depression Screen     HIV screen     Hepatitis C screen     DTaP/Tdap/Td vaccine (1 - Tdap)    Cervical cancer screen        Wt Readings from Last 3 Encounters:   No data found for Wt     Temp Readings from Last 3 Encounters:   No data found for Temp     BP Readings from Last 3 Encounters:   No data found for BP     Pulse Readings from Last 3 Encounters:   No data found for Pulse           Depression Screening:  :         03/01/2023    10:28 AM   PHQ-9 Questionaire   Little interest or pleasure in doing things 1   Feeling down, depressed, or hopeless 1   Trouble falling or staying asleep, or sleeping too much 2   Feeling tired or having little energy 1   Poor appetite or overeating 0   Feeling bad about yourself - or that you are a failure or have let yourself or your family down 0   Trouble concentrating on things, such as reading the newspaper or watching television 1   Moving or speaking so slowly that other people could have noticed. Or the opposite - being so fidgety or restless that you have been moving around a lot more than usual 0   Thoughts that you would be better off dead, or of hurting yourself in some way 0   PHQ-9 Total Score 6   If you checked off any problems, how difficult have these problems made it for you to do your work, take care of things at home, or get along with other people? 1        Fall Risk Assessment:  :          No data to display                 Abuse  Screening:  :          No data to display                 Coordination of Care Questionnaire:  :     "Have you been to the ER, urgent care clinic since your last visit?  Hospitalized since your last visit?"    YES - When: approximately 3 days ago.  Where and Why: [facility] for colonoscopy .    "Have you seen or consulted any other health care providers outside of [facility] since your last visit?"    NO        "Have you had  a pap smear?"    NO but is scheduled for October     No cervical cancer screening on file

## 2023-03-01 NOTE — Progress Notes (Signed)
Assessment & Plan   1. Acquired hypothyroidism  Comments:  was diagnosed with hypothyroidism during the birth of her first child  Orders:  -     Thyroid Cascade Profile; Future  2. Other iron deficiency anemia  -     Comprehensive Metabolic Panel; Future  3. Hypovitaminosis D  Comments:  was diagnsoed with low vitamin D  Orders:  -     Vitamin D 25 Hydroxy; Future  4. Menorrhagia with regular cycle  Comments:  has a paraguard, for the past 7 years  5. H/O gastric bypass     No future appointments.      Patient would like to establish with a neuropsychologist to discuss symptoms of inability to concentrate. Would like to rule out nutritional causes, before sending patient to neuropsychologist.     No follow-ups on file.       Subjective   Janet Alexander (DOB:  1991/02/27) is a 32 y.o. female,New patient, here for evaluation of the following chief complaint(s):  New Patient (Patient is here to establish care, no recent prior PCP  ), Thyroid Problem (Patient would like to have labs checked for thyroid and get on the correct dosage of medications ), Anemia (Patient has anemia and is scheduled to see [facility] hematologist ), and Referral - General (Patient is scheduled to see Dr. Sandria Manly for gynecology but may need to get in sooner for anemia. And would like get a referral for possible autism and possible adhd )      Janet Alexander is a 32 y.o. female presents to establish care.     Has a history of gastric bypass surgery, has been told she is severely anemic and is seeing [facility] this afternoon.     She has a history of hypothyroidism.        Review of Systems   Constitutional:  Negative for activity change, appetite change, fatigue and fever.   HENT:  Negative for congestion, postnasal drip, rhinorrhea, sinus pressure, sinus pain, sneezing and sore throat.    Respiratory:  Negative for cough, chest tightness, shortness of breath and wheezing.    Cardiovascular:  Negative for chest pain, palpitations and leg  swelling.   Gastrointestinal:  Negative for abdominal distention, abdominal pain, anal bleeding, blood in stool, constipation, diarrhea, nausea and vomiting.   Endocrine: Negative for cold intolerance, heat intolerance, polydipsia, polyphagia and polyuria.   Genitourinary:  Negative for dyspareunia, genital sores, hematuria, menstrual problem, pelvic pain, vaginal bleeding, vaginal discharge and vaginal pain.   Musculoskeletal:  Negative for arthralgias, back pain, gait problem, joint swelling and neck pain.   Skin:  Negative for pallor, rash and wound.   Allergic/Immunologic: Negative for environmental allergies, food allergies and immunocompromised state.   Hematological:  Negative for adenopathy. Does not bruise/bleed easily.   Psychiatric/Behavioral:  Negative for behavioral problems, decreased concentration, dysphoric mood, self-injury, sleep disturbance and suicidal ideas. The patient is not nervous/anxious.           Objective   Physical Exam  Vitals and nursing note reviewed.   Constitutional:       Appearance: Normal appearance. She is normal weight.   HENT:      Head: Normocephalic and atraumatic.      Right Ear: Tympanic membrane, ear canal and external ear normal.      Left Ear: Tympanic membrane, ear canal and external ear normal.      Nose: Nose normal.      Mouth/Throat:  Mouth: Mucous membranes are moist.      Pharynx: Oropharynx is clear.   Eyes:      Extraocular Movements: Extraocular movements intact.      Conjunctiva/sclera: Conjunctivae normal.      Pupils: Pupils are equal, round, and reactive to light.   Cardiovascular:      Rate and Rhythm: Normal rate and regular rhythm.      Pulses: Normal pulses.      Heart sounds: Normal heart sounds.   Pulmonary:      Effort: Pulmonary effort is normal.      Breath sounds: Normal breath sounds.   Abdominal:      General: Abdomen is flat. Bowel sounds are normal.      Palpations: Abdomen is soft.   Musculoskeletal:         General: Normal range of  motion.      Cervical back: Normal range of motion and neck supple.   Skin:     General: Skin is warm and dry.      Capillary Refill: Capillary refill takes less than 2 seconds.   Neurological:      General: No focal deficit present.      Mental Status: She is alert and oriented to person, place, and time. Mental status is at baseline.   Psychiatric:         Mood and Affect: Mood normal.         Behavior: Behavior normal.         Thought Content: Thought content normal.         Judgment: Judgment normal.                  An electronic signature was used to authenticate this note.    --Aida Raider, MD

## 2023-03-02 LAB — COMPREHENSIVE METABOLIC PANEL
ALT: 17 U/L (ref 12–78)
AST: 13 U/L — ABNORMAL LOW (ref 15–37)
Albumin/Globulin Ratio: 1.5 (ref 1.1–2.2)
Albumin: 4.4 g/dL (ref 3.5–5.0)
Alk Phosphatase: 48 U/L (ref 45–117)
Anion Gap: 4 mmol/L — ABNORMAL LOW (ref 5–15)
BUN/Creatinine Ratio: 22 — ABNORMAL HIGH (ref 12–20)
BUN: 16 mg/dL (ref 6–20)
CO2: 28 mmol/L (ref 21–32)
Calcium: 9.6 mg/dL (ref 8.5–10.1)
Chloride: 107 mmol/L (ref 97–108)
Creatinine: 0.72 mg/dL (ref 0.55–1.02)
Est, Glom Filt Rate: >90 mL/min/{1.73_m2} (ref >60)
Globulin: 3 g/dL (ref 2.0–4.0)
Glucose: 85 mg/dL (ref 65–100)
Potassium: 4.5 mmol/L (ref 3.5–5.1)
Sodium: 139 mmol/L (ref 136–145)
Total Bilirubin: 0.6 mg/dL (ref 0.2–1.0)
Total Protein: 7.4 g/dL (ref 6.4–8.2)

## 2023-03-02 LAB — VITAMIN D 25 HYDROXY: Vit D, 25-Hydroxy: 37.3 ng/mL (ref 30–100)

## 2023-03-03 LAB — T4, FREE, DIRECT, REFLEX: T4,FREE,(DIRECT): 1.33 ng/dL (ref 0.82–1.77)

## 2023-03-03 LAB — THYROID PEROXIDASE ANTIBODY, REFELX: Thyroid Peroxidase Antibody: 29 [IU]/mL (ref 0–34)

## 2023-03-03 LAB — THYROID CASCADE PROFILE: TSH: 10.2 u[IU]/mL — ABNORMAL HIGH (ref 0.450–4.500)

## 2023-03-04 NOTE — Other (Signed)
Patient reviewed lab results and recommendations in mychart

## 2023-03-04 NOTE — Other (Signed)
Patient has a history of gastric bypass, and is on thyroid medications. Please ensure she takes her thyroid medications first thing in the morning on an empty stomach 2 hours prior to eating or drinking anything besides water. 4 am is usually the best time. her TSH levels may be low due to a mild absorption issue. Will continue to monitor for now.

## 2023-03-05 ENCOUNTER — Telehealth

## 2023-03-05 MED ORDER — LEVOTHYROXINE SODIUM 125 MCG PO TABS
125 MCG | ORAL_TABLET | Freq: Every day | ORAL | 1 refills | Status: AC
Start: 2023-03-05 — End: 2023-08-26

## 2023-03-05 NOTE — Telephone Encounter (Signed)
-----   Message from [place], [place] sent at 03/05/2023  8:48 AM EDT -----  Regarding: FW: Thyroid Levels and Medication  Contact: [phone]    ----- Message -----  From: Lestine Mount  Sent: 03/04/2023  10:56 PM EDT  To: [facility] Clinical Staff  Subject: Thyroid Levels and Medication                    Good evening Dr. Camillo Flaming,    I realize during our appointment that I didn't mention that I'm in need of a levothyroxine refill. I've been currently using my husband's similar dose in the meantime, 137 mcg. Would you be able to help me with a prescription refill and adjustment to better suit my thyroid levels?    Thank you,    Jorge Ny

## 2023-05-28 ENCOUNTER — Other Ambulatory Visit: Payer: Self-pay

## 2023-05-28 ENCOUNTER — Ambulatory Visit (INDEPENDENT_AMBULATORY_CARE_PROVIDER_SITE_OTHER): Payer: BC Managed Care – PPO | Admitting: Allergy & Immunology

## 2023-05-28 ENCOUNTER — Encounter: Payer: Self-pay | Admitting: Allergy & Immunology

## 2023-05-28 DIAGNOSIS — J4520 Mild intermittent asthma, uncomplicated: Secondary | ICD-10-CM | POA: Diagnosis not present

## 2023-05-28 DIAGNOSIS — E039 Hypothyroidism, unspecified: Secondary | ICD-10-CM

## 2023-05-28 DIAGNOSIS — J3089 Other allergic rhinitis: Secondary | ICD-10-CM | POA: Diagnosis not present

## 2023-05-28 MED ORDER — LEVOCETIRIZINE DIHYDROCHLORIDE 5 MG PO TABS: 5.0000 mg | ORAL_TABLET | Freq: Every day | ORAL | 3 refills | Status: AC

## 2023-05-28 NOTE — Patient Instructions (Addendum)
1. Allergic rhinitis (dust mites, mold, cat) - Continue environmental control measures as below.  - Continue with Xyzal (levocetirizine) 5 mg up to twice daily as needed.  - Continue Ryaltris (olopatadine + mometasone nasal spray combination) 1-2 sprays per nostril twice a day. This replaces your other nasal sprays. If this works well for you, then have [facility] ship the medication to your home - prescription already sent in.   2. Mild intermittent asthma - Lung testing not done since this was a televisit. - You seem to have a good handle on your symptoms. - We did not send any inhalers and since she seemed to have a backlog of them. - Daily controller medication(s): NOTHING - During upper respiratory infections/flares:  Start Symbicort 2 puffs twice a day with spacer and rinse mouth afterwards for 1-2 weeks until your breathing symptoms return to baseline.  Pretreat with AirSupra 2 puffs - May use AirSupra rescue inhaler 2 puffs every 4 to 6 hours as needed for shortness of breath, chest tightness, coughing, and wheezing. Monitor frequency of use.  - Asthma control goals:  Full participation in all desired activities (may need albuterol before activity) Albuterol use two times or less a week on average (not counting use with activity) Cough interfering with sleep two times or less a month Oral steroids no more than once a year No hospitalizations   3. Return in about 1 year (around 05/27/2024).   Please inform us of any Emergency Department visits, hospitalizations, or changes in symptoms. Call us before going to the ED for breathing or allergy symptoms since we might be able to fit you in for a sick visit. Feel free to contact us anytime with any questions, problems, or concerns.  It was a pleasure to see you again today!  Websites that have reliable patient information: 1. American Academy of Asthma, Allergy, and Immunology: www.aaaai.org 2. Food Allergy Research and Education  (FARE): foodallergy.org 3. Mothers of Asthmatics: http://www.asthmacommunitynetwork.org 4. American College of Allergy, Asthma, and Immunology: www.acaai.org   COVID-19 Vaccine Information can be found at: [email or web address].[email or web address] For questions related to vaccine distribution or appointments, please email [email or web address] or call [phone].   We realize that you might be concerned about having an allergic reaction to the COVID19 vaccines. To help with that concern, WE ARE OFFERING THE COVID19 VACCINES IN OUR OFFICE! Ask the front desk for dates!     "Like" [name] on Facebook and Instagram for our latest updates!      A healthy democracy works best when [employer] participate! Make sure you are registered to vote! If you have moved or changed any of your contact information, you will need to get this updated before voting!  In some cases, you MAY be able to register to vote online: [email or web address]

## 2023-05-28 NOTE — Progress Notes (Signed)
RE: CRICKETT STOCKSDALE MRN: [id number] DOB: [birth year] Date of Telemedicine Visit: 05/28/2023  Referring provider: Julianne Handler, NP Primary care provider: Julianne Handler, NP  Chief Complaint: Medication Management and Asthma   Telemedicine Follow Up Visit via Telephone: I connected with Eliezer Champagne for a follow up on 05/28/23 by telephone and verified that I am speaking with the correct person using two identifiers.   I discussed the limitations, risks, security and privacy concerns of performing an evaluation and management service by telephone and the availability of in person appointments. I also discussed with the patient that there may be a patient responsible charge related to this service. The patient expressed understanding and agreed to proceed.  Patient is at home accompanied by no one who provided/contributed to the history.  Provider is at the office.  Visit start time: 12:51 PM Visit end time: 13:11 PM Insurance consent/check in by: Dr. Reece Agar Medical consent and medical assistant/nurse: Dr. Reece Agar  History of Present Illness:  She is a 32 y.o. female, who is being followed for allergic rhinitis as well as mild intermittent asthma. Her previous allergy office visit was in March 2024 with myself.  At that visit, we continue with Xyzal 5 mg up to twice daily as well as Ryaltris 1 to 2 sprays per nostril twice a day as needed.  For her asthma, her lung testing looked amazing.  We gave her a sample of AirSupra to use twice daily as needed.  During flares, she adds on Symbicort 160 mcg 2 puffs twice daily for 1 to 2 weeks.  Since last visit, she has done well.  Asthma/Respiratory Symptom History: She has not been using her Symbicort or her rescue inhaler much at all.  She has not been on prednisone at all since her visit.  Typically she is triggered by symptoms including coughing and wheezing, but this is mostly during physical activity.  She feels fine even if she does not use  Symbicort on a daily basis.  She has not been to the ER and has not been on prednisone at all since the last time I saw her.  Allergic Rhinitis Symptom History: Allergic rhinitis is mostly under control as long as he takes her levocetirizine daily.  She has a nasal spray but has not used it in several months.  She has not been on antibiotics.  She has not had any sinus or ear infections.  She feels that her symptoms are under pretty good control with where she is at right now.  She just needs to be better about taking her antihistamine and she knows that.  She does have an indoor cat which she knows bothers her, but obviously she is not can to get rid of her cat.  Her thyroid is under control now.  Her PCP is now managing this.  She is in a good spot.  She continues to love her job at [employer] where she works.  And allows much more time for her family and they are even paying for her CPA tests.  She took her second 1 for yesterday.  She is ready to she if she passed.  Otherwise, there have been no changes to her past medical history, surgical history, family history, or social history.  Assessment and Plan:  Deanza is a 32 y.o. female with:  Perennial allergic rhinitis (indoor molds, dust mite, cat) - with current flare since she is staying with her father   Food intolerance -  improved since her bariatric surgery   Mild intermittent asthma, uncomplicated   1. Allergic rhinitis (dust mites, mold, cat) - Continue environmental control measures as below.  - Continue with Xyzal (levocetirizine) 5 mg up to twice daily as needed.  - Continue Ryaltris (olopatadine + mometasone nasal spray combination) 1-2 sprays per nostril twice a day. This replaces your other nasal sprays. If this works well for you, then have [facility] ship the medication to your home - prescription already sent in.   2. Mild intermittent asthma - Lung testing not done since this was a televisit. - You seem to have a  good handle on your symptoms. - We did not send any inhalers and since she seemed to have a backlog of them. - Daily controller medication(s): NOTHING - During upper respiratory infections/flares:  Start Symbicort 2 puffs twice a day with spacer and rinse mouth afterwards for 1-2 weeks until your breathing symptoms return to baseline.  Pretreat with AirSupra 2 puffs - May use AirSupra rescue inhaler 2 puffs every 4 to 6 hours as needed for shortness of breath, chest tightness, coughing, and wheezing. Monitor frequency of use.  - Asthma control goals:  Full participation in all desired activities (may need albuterol before activity) Albuterol use two times or less a week on average (not counting use with activity) Cough interfering with sleep two times or less a month Oral steroids no more than once a year No hospitalizations   3. Return in about 1 year (around 05/27/2024).   Diagnostics: None.  Medication List:  Current Outpatient Medications  Medication Sig Dispense Refill   albuterol (VENTOLIN HFA) 108 (90 Base) MCG/ACT inhaler Inhale 2 puffs into the lungs every 4 (four) hours as needed. 18 g 1   Albuterol-Budesonide (AIRSUPRA) 90-80 MCG/ACT AERO Inhale 2 puffs into the lungs every 4 (four) hours as needed. 10.7 g 5   budesonide-formoterol (SYMBICORT) 160-4.5 MCG/ACT inhaler INHALE 2 PUFFS TWO TIMES DAILY WITH SPACER 10.2 each 5   calcium citrate-vitamin D (CALCIUM CITRATE CHEWY BITE) 500-500 MG-UNIT chewable tablet Chew 1 tablet by mouth 3 (three) times daily.     levocetirizine (XYZAL) 5 MG tablet Take 1 tablet (5 mg total) by mouth daily. 90 tablet 3   levothyroxine (SYNTHROID) 175 MCG tablet Take 175 mcg by mouth at bedtime.     loratadine (CLARITIN) 10 MG tablet Take 10 mg by mouth daily.     Multiple Vitamins-Minerals (CELEBRATE MULTI-COMPLETE 45) CHEW Chew 1 tablet by mouth in the morning.     Olopatadine-Mometasone (RYALTRIS) X543819 MCG/ACT SUSP Place 2 sprays into the  nose 2 (two) times daily as needed. 29 g 11   PARAGARD INTRAUTERINE COPPER IUD IUD 1 each by Intrauterine route once.     No current facility-administered medications for this visit.   Allergies: Allergies  Allergen Reactions   Cat Hair Extract Cough, Itching and Shortness Of Breath   Dust Mite Extract Cough, Itching and Shortness Of Breath    Other reaction(s): Cough   Molds & Smuts Cough, Itching, Shortness Of Breath and Swelling   Casein Other (See Comments) and Swelling   Soy Allergy Rash   Dairy Aid [Tilactase]    Gluten Meal    I reviewed her past medical history, social history, family history, and environmental history and no significant changes have been reported from previous visits.  Review of Systems  Constitutional:  Negative for activity change, appetite change, chills, diaphoresis and fatigue.  HENT:  Negative for congestion, postnasal  drip, rhinorrhea, sinus pressure, sinus pain, sneezing and sore throat.   Eyes:  Negative for pain, discharge, redness and itching.  Respiratory:  Negative for cough, shortness of breath, wheezing and stridor.   Gastrointestinal:  Negative for diarrhea, nausea and vomiting.  Endocrine: Negative for cold intolerance and heat intolerance.  Musculoskeletal:  Negative for arthralgias, joint swelling and myalgias.  Skin:  Negative for rash.  Allergic/Immunologic: Negative for environmental allergies and food allergies.    Objective:  Physical exam not obtained as encounter was done via telephone.   Previous notes and tests were reviewed.  I discussed the assessment and treatment plan with the patient. The patient was provided an opportunity to ask questions and all were answered. The patient agreed with the plan and demonstrated an understanding of the instructions.   The patient was advised to call back or seek an in-person evaluation if the symptoms worsen or if the condition fails to improve as anticipated.  I provided 20 minutes  of non-face-to-face time during this encounter.  It was my pleasure to participate in Seltzer Becraft's care today. Please feel free to contact me with any questions or concerns.   Sincerely,  Alfonse Spruce, MD

## 2023-06-19 ENCOUNTER — Ambulatory Visit
Admit: 2023-06-19 | Discharge: 2023-06-19 | Payer: BLUE CROSS/BLUE SHIELD | Attending: Obstetrics & Gynecology | Primary: Family Medicine

## 2023-06-19 DIAGNOSIS — Z01419 Encounter for gynecological examination (general) (routine) without abnormal findings: Secondary | ICD-10-CM

## 2023-06-19 NOTE — Progress Notes (Signed)
Janet Alexander is a 32 y.o. female returns for an annual exam & pap    Wants to discuss heavy cycles on ParaGard. Relays that she does have family hx of heavy cycles.     Had weight loss surgery July 2022    Chief Complaint   Patient presents with    Annual Exam     G5P2 - c/s (baby breech) and vaginal   Patient's last menstrual period was 06/15/2023.  Her periods are heavy in flow and usually regular with a 26-32 day interval with 3-7 day duration.  She does not have dysmenorrhea.  Problems:   Birth Control: ParaGard IUD 2018  Last Pap: 6 years ago, normal per patient       1. Have you been to the ER, urgent care clinic, or hospitalized since your last visit? New patient     2. Have you seen or consulted any other health care providers outside of the [facility] since your last visit? New patient     Examination chaperoned by Alferd Patee, LPN.

## 2023-06-19 NOTE — Progress Notes (Signed)
Annual exam    Janet Alexander is a 32 y.o. presenting for annual exam.     She has the following gynecologic concerns today: wellness    Her menses are regular but very heavy. Her periods are heavy in flow and usually regular with a 26-32 day interval with 3-7 day duration. She will bleed through her clothes at work.  She feels like they have gotten heavier after her bariatric surgery. She is anemic.     She is using Paraguard IUD for contraception.    July 2022 bariatric surgery.      She works as an Airline pilot.     She is due for cervical cancer screening.      She declines a chaperone during the gynecologic exam today.     Ob/Gyn Hx:  G5P2 - c/s (for breech) then VBAC  LMP 06/15/2023.  Contraception: ParaGard IUD 2018  Last Pap: 6 years ago, normal per patient      Past Medical History:   Diagnosis Date    Hypothyroidism     Iron deficiency anemia     Postpartum depression 2016       Past Surgical History:   Procedure Laterality Date    BARIATRIC SURGERY      CERVICAL CERCLAGE  2012    another in 85    CESAREAN SECTION  2012       History reviewed. No pertinent family history.    Social History     Socioeconomic History    Marital status: Unknown     Spouse name: Not on file    Number of children: Not on file    Years of education: Not on file    Highest education level: Not on file   Occupational History    Not on file   Tobacco Use    Smoking status: Never    Smokeless tobacco: Never   Vaping Use    Vaping status: Never Used   Substance and Sexual Activity    Alcohol use: Yes    Drug use: Yes     Types: Marijuana Sheran Fava)    Sexual activity: Yes     Partners: Male   Other Topics Concern    Not on file   Social History Narrative    Not on file     Social Determinants of Health     Financial Resource Strain: Low Risk  (06/19/2023)    Overall Financial Resource Strain (CARDIA)     Difficulty of Paying Living Expenses: Not hard at all   Food Insecurity: No Food Insecurity (06/19/2023)    Hunger Vital Sign      Worried About Running Out of Food in the Last Year: Never true     Ran Out of Food in the Last Year: Never true   Transportation Needs: Unknown (06/19/2023)    PRAPARE - Therapist, art (Medical): Not on file     Lack of Transportation (Non-Medical): No   Physical Activity: Not on file   Stress: Not on file   Social Connections: Unknown (01/15/2022)    Received from [facility]    Social Network     Social Network: Not on file   Intimate Partner Violence: Unknown (12/07/2021)    Received from [facility]    HITS     Physically Hurt: Not on file     Insult or Talk Down To: Not on file     Threaten Physical Harm: Not on file  Scream or Curse: Not on file   Housing Stability: Unknown (06/19/2023)    Housing Stability Vital Sign     Unable to Pay for Housing in the Last Year: Not on file     Number of Times Moved in the Last Year: Not on file     Homeless in the Last Year: No       Current Outpatient Medications   Medication Sig Dispense Refill    levothyroxine (SYNTHROID) 125 MCG tablet Take 1 tablet by mouth Daily      Multiple Vitamin (MVI, BARIATRIC ADVANTAGE VITABAND, CHEW TAB) Take 1 tablet by mouth daily      ferrous sulfate (FE TABS 325) 325 (65 Fe) MG EC tablet Take 1 tablet by mouth 3 times daily (with meals)       No current facility-administered medications for this visit.       No Known Allergies    Review of Systems - History obtained from the patient  Constitutional: negative for weight loss, fever, night sweats  HEENT: negative for hearing loss, earache, congestion, snoring, sorethroat  CV: negative for chest pain, palpitations, edema  Resp: negative for cough, shortness of breath, wheezing  GI: negative for change in bowel habits, abdominal pain, black or bloody stools  GU: negative for frequency, dysuria, hematuria, vaginal discharge  MSK: negative for back pain, joint pain, muscle pain  Breast: negative for breast lumps, nipple discharge, galactorrhea  Skin :negative for  itching, rash, hives  Neuro: negative for dizziness, headache, confusion, weakness  Psych: negative for anxiety, depression, change in mood  Heme/lymph: negative for bleeding, bruising, pallor    Physical Exam    BP 104/69   Pulse 65   Temp 97.9 F (36.6 C) (Oral)   Resp 20   Ht 1.753 m (5\' 9" )   Wt 95.7 kg (211 lb)   LMP 06/15/2023   SpO2 97%   BMI 31.16 kg/m     Constitutional  Appearance: well-nourished, well developed, alert, in no acute distress    HENT  Head and Face: appears normal    Neck  Inspection/Palpation: normal appearance, no masses or tenderness  Lymph Nodes: no lymphadenopathy present  Thyroid: gland size normal, nontender, no nodules or masses present on palpation    Chest  Respiratory Effort: non-labored breathing  Auscultation: CTAB, normal breath sounds    Cardiovascular  Heart:  Auscultation: regular rate and rhythm without murmur  Extremities: no peripheral edema    Breasts  Inspection of Breasts: breasts symmetrical, no skin changes, no discharge present, nipple appearance normal, no skin retraction present  Palpation of Breasts and Axillae: no masses present on palpation, no breast tenderness  Axillary Lymph Nodes: no lymphadenopathy present    Gastrointestinal  Abdominal Examination: abdomen non-tender to palpation, normal bowel sounds, no masses present  Liver and spleen: no hepatomegaly present, spleen not palpable  Hernias: no hernias identified    Genitourinary  External Genitalia: normal appearance for age, no discharge present, no tenderness present, no inflammatory lesions present, no masses present, no atrophy present  Vagina: normal vaginal vault without central or paravaginal defects, no discharge present, no inflammatory lesions present, no masses present, light menstrual blood present  Bladder: non-tender to palpation  Urethra: appears normal  Cervix: normal IUD strings 3cm  Uterus: normal size, shape and consistency  Adnexa: no adnexal tenderness present, no adnexal  masses present  Perineum: perineum within normal limits, no evidence of trauma, no rashes or skin lesions present  Skin  General Inspection: no rash, no lesions identified    Neurologic/Psychiatric  Mental Status:  Orientation: grossly oriented to person, place and time  Mood and Affect: mood normal, affect appropriate      Assessment/Plan:  32 y.o. presenting for annual exam. Overall doing well.     Well woman exam:  Normal gynecologic and breast exams.   Healthy habits and lifestyle reviewed.   Pap with HPV performed today.  Patient declines STD screening.  Contraception and menstrual regulation - patient opts for cont Paraguard IUD    1. Encounter for gynecological examination without abnormal finding    - PAP IG, Aptima HPV and rfx 16/18,45 ([id number])    2. Menorrhagia with regular cycle  Discussed possible causes including her Paraguard IUD.   Recommended returning for GYN [name] and MD followup.   Discussed option of adding TXA with menses, or replacing copper IUD with Mirena IUD.          Christoper Allegra, MD

## 2023-06-24 LAB — PAP IG, APTIMA HPV AND RFX 16/18,45 (199305)
.: 0
HPV Aptima: NEGATIVE

## 2023-06-26 NOTE — Telephone Encounter (Signed)
The patient returned a call regarding pap smear results.  I spoke with the patient, advised of results and Dr. Imagene Gurney recommendations.  The patient  verbalized understanding.  Reassurance offered.

## 2023-07-23 ENCOUNTER — Ambulatory Visit: Payer: BLUE CROSS/BLUE SHIELD | Primary: Diagnostic Radiology

## 2023-07-23 ENCOUNTER — Ambulatory Visit: Payer: BLUE CROSS/BLUE SHIELD | Attending: Obstetrics & Gynecology | Primary: Diagnostic Radiology

## 2023-07-25 ENCOUNTER — Encounter: Admit: 2023-07-25 | Admitting: Obstetrics & Gynecology

## 2023-07-25 DIAGNOSIS — N920 Excessive and frequent menstruation with regular cycle: Secondary | ICD-10-CM

## 2023-07-25 NOTE — Progress Notes (Signed)
 Per provider ordered placed for Menorrhagia

## 2023-08-07 ENCOUNTER — Ambulatory Visit: Admit: 2023-08-07 | Payer: PRIVATE HEALTH INSURANCE | Primary: Family Medicine

## 2023-08-07 ENCOUNTER — Ambulatory Visit: Admit: 2023-08-07 | Discharge: 2023-08-21 | Payer: PRIVATE HEALTH INSURANCE | Primary: Family Medicine

## 2023-08-07 ENCOUNTER — Ambulatory Visit
Admit: 2023-08-07 | Discharge: 2023-08-20 | Payer: BLUE CROSS/BLUE SHIELD | Attending: Obstetrics & Gynecology | Admitting: Obstetrics & Gynecology | Primary: Family Medicine

## 2023-08-07 VITALS — BP 99/64 | HR 61 | Temp 97.90000°F | Resp 18 | Ht 69.0 in | Wt 207.0 lb

## 2023-08-07 DIAGNOSIS — N920 Excessive and frequent menstruation with regular cycle: Secondary | ICD-10-CM

## 2023-08-07 NOTE — Progress Notes (Signed)
 Janet Alexander is a 32 y.o. female presents for a problem visit. Patient is following up today for heavy cycles.    Patient relays that she has received iron infusions through [facility] every 3 mo    Chief Complaint   Patient presents with    Menorrhagia     G5

## 2023-08-07 NOTE — Progress Notes (Signed)
 Problem Visit    Janet Alexander is a 32 y.o.  presenting for problem visit.     Her main concern today is menorrhagia.  Needing iron infusions through [facility].    The first 2-3 days of her menses are so heavy that she has bled through a tampon and pad while a

## 2023-08-14 ENCOUNTER — Ambulatory Visit: Admit: 2023-08-14 | Payer: PRIVATE HEALTH INSURANCE | Admitting: Obstetrics & Gynecology | Primary: Family Medicine

## 2023-08-14 VITALS — BP 94/60 | HR 64 | Temp 97.90000°F | Resp 20 | Ht 68.0 in | Wt 206.0 lb

## 2023-08-14 DIAGNOSIS — Z30433 Encounter for removal and reinsertion of intrauterine contraceptive device: Principal | ICD-10-CM

## 2023-08-14 LAB — AMB POC URINE PREGNANCY TEST, VISUAL COLOR COMPARISON: HCG, Pregnancy, Urine, POC: NEGATIVE

## 2023-08-14 MED ORDER — LEVONORGESTREL 20 MCG/DAY IU IUD
20 | Freq: Once | INTRAUTERINE | Status: AC
Start: 2023-08-14 — End: 2023-08-14
  Administered 2023-08-14: 17:00:00 1 via INTRAUTERINE

## 2023-08-14 NOTE — Progress Notes (Signed)
 IUD REMOVAL  Indications for Removal:  The patient presents today for IUD removal because of menorrhagia.  The IUD removal procedure was discussed with the patient and she had no further questions. Consent was obtained.    Procedure: The patient was placed

## 2023-08-14 NOTE — Progress Notes (Signed)
 Janet Alexander is a 32 y.o. female presents for a problem visit.    Patient being seen for IUD insertion    Chief Complaint   Patient presents with    IUD insertion     Patient's last menstrual period was 07/09/2023. Regular but heavy  Birth Control: desi

## 2023-08-24 ENCOUNTER — Encounter

## 2023-08-26 MED ORDER — LEVOTHYROXINE SODIUM 125 MCG PO TABS
125 | ORAL_TABLET | Freq: Every day | ORAL | 1 refills | Status: DC
Start: 2023-08-26 — End: 2024-04-10

## 2023-09-03 ENCOUNTER — Encounter: Admit: 2023-09-03 | Admitting: Family Medicine

## 2023-09-03 DIAGNOSIS — E039 Hypothyroidism, unspecified: Secondary | ICD-10-CM

## 2023-09-30 ENCOUNTER — Ambulatory Visit: Admit: 2023-09-30 | Discharge: 2023-10-16 | Payer: BLUE CROSS/BLUE SHIELD | Primary: Family Medicine

## 2023-09-30 ENCOUNTER — Ambulatory Visit
Admit: 2023-09-30 | Discharge: 2023-10-07 | Payer: PRIVATE HEALTH INSURANCE | Attending: Obstetrics & Gynecology | Primary: Family Medicine

## 2023-09-30 ENCOUNTER — Ambulatory Visit: Admit: 2023-09-30 | Payer: PRIVATE HEALTH INSURANCE | Primary: Family Medicine

## 2023-09-30 VITALS — BP 96/62 | HR 60 | Temp 98.30000°F | Resp 20 | Ht 68.0 in | Wt 207.0 lb

## 2023-09-30 DIAGNOSIS — Z30431 Encounter for routine checking of intrauterine contraceptive device: Secondary | ICD-10-CM

## 2023-09-30 NOTE — Progress Notes (Signed)
Problem Visit    Janet Alexander is a 33 y.o.  presenting for problem visit.     Her main concern today is an IUD check.  She had irregular prolonged spotting after the insertion, now resolved! She has had one menses since the insertion which was a normal flow which is a huge improvement for her.   No abnormal cramping.      Mirena IUD inserted 08/14/23 (Paraguard removed).     Past Medical History:   Diagnosis Date    Asthma     Hypothyroidism     Iron deficiency anemia     Postpartum depression 2016    VBAC (vaginal birth after Cesarean) 01/11/15       Past Surgical History:   Procedure Laterality Date    BARIATRIC SURGERY      CERVICAL CERCLAGE  2012    another in 2015    CESAREAN SECTION  2012    GASTRIC BYPASS SURGERY  02/2021       Family History   Problem Relation Age of Onset    Diabetes Mother     Hypertension Mother     Scoliosis Mother     COPD Father     Colon Cancer Father     Hypertension Maternal Grandmother     Asthma Sister        Social History     Socioeconomic History    Marital status: Married     Spouse name: Not on file    Number of children: Not on file    Years of education: Not on file    Highest education level: Not on file   Occupational History    Not on file   Tobacco Use    Smoking status: Never     Passive exposure: Never    Smokeless tobacco: Never   Vaping Use    Vaping status: Never Used   Substance and Sexual Activity    Alcohol use: Yes     Comment: I dont drink weekly, maybe 1-2 times a month    Drug use: Yes     Frequency: 5.0 times per week     Types: Marijuana Sheran Fava)    Sexual activity: Yes     Partners: Male   Other Topics Concern    Not on file   Social History Narrative    ** Merged History Encounter **          Social Determinants of Health     Financial Resource Strain: Low Risk  (06/19/2023)    Overall Financial Resource Strain (CARDIA)     Difficulty of Paying Living Expenses: Not hard at all   Food Insecurity: No Food Insecurity (09/30/2023)    Hunger Vital Sign      Worried About Running Out of Food in the Last Year: Never true     Ran Out of Food in the Last Year: Never true   Transportation Needs: No Transportation Needs (09/30/2023)    PRAPARE - Therapist, art (Medical): No     Lack of Transportation (Non-Medical): No   Physical Activity: Not on file   Stress: Not on file   Social Connections: Unknown (01/15/2022)    Received from [facility]    Social Network     Social Network: Not on file   Intimate Partner Violence: Unknown (12/07/2021)    Received from [facility]    HITS     Physically Hurt: Not on file  Insult or Talk Down To: Not on file     Threaten Physical Harm: Not on file     Scream or Curse: Not on file   Housing Stability: Low Risk  (09/30/2023)    Housing Stability Vital Sign     Unable to Pay for Housing in the Last Year: No     Number of Times Moved in the Last Year: 0     Homeless in the Last Year: No        Current Outpatient Medications   Medication Sig Dispense Refill    levothyroxine (SYNTHROID) 125 MCG tablet Take 1 tablet by mouth Daily      levocetirizine (XYZAL) 5 MG tablet TAKE 1 TABLET BY MOUTH EVERY MORNING AND TAKE ONE TABLET BY MOUTH AT BEDTIME      AIRSUPRA 90-80 MCG/ACT AERO Inhale 2 puffs into the lungs every 4 hours as needed      levothyroxine (SYNTHROID) 125 MCG tablet TAKE 1 TABLET BY MOUTH EVERY DAY 90 tablet 1    Calcium Citrate-Vitamin D (CALCIUM CITRATE CHEWY BITE) 500-12.5 MG-MCG CHEW Take 1 tablet by mouth 3 times daily      Paragard Intrauterine Copper IUD 1 each by IntraUTERine route once      levocetirizine (XYZAL) 5 MG tablet 1 tablet      Multiple Vitamin (MVI, BARIATRIC ADVANTAGE VITABAND, CHEW TAB) Take 1 tablet by mouth daily      ferrous sulfate (FE TABS 325) 325 (65 Fe) MG EC tablet Take 1 tablet by mouth 3 times daily (with meals)       No current facility-administered medications for this visit.       Allergies   Allergen Reactions    Molds & Smuts Shortness Of Breath    Cat Dander  Other (See Comments)    Dust Mite Extract Rash       Review of Systems - History obtained from the patient  Constitutional: negative for weight loss, fever, night sweats  HEENT: negative for hearing loss, earache, congestion, snoring, sorethroat  CV: negative for chest pain, palpitations, edema  Resp: negative for cough, shortness of breath, wheezing  GI: negative for change in bowel habits, abdominal pain, black or bloody stools  GU: negative for frequency, dysuria, hematuria, vaginal discharge  MSK: negative for back pain, joint pain, muscle pain  Breast: negative for breast lumps, nipple discharge, galactorrhea  Skin :negative for itching, rash, hives  Neuro: negative for dizziness, headache, confusion, weakness  Psych: negative for anxiety, depression, change in mood  Heme/lymph: negative for bleeding, bruising, pallor    Physical Exam    BP 96/62   Pulse 60   Temp 98.3 F (36.8 C) (Oral)   Resp 20   Ht 1.727 m (5\' 8" )   Wt 93.9 kg (207 lb)   LMP 09/06/2023   SpO2 96%   BMI 31.47 kg/m       OBGyn Exam      Constitutional  Appearance: well-nourished, well developed, alert, in no acute distress    HENT  Head and Face: appears normal    Neck  Inspection/Palpation: normal appearance, no masses or tenderness  Thyroid: gland size normal, nontender    Chest  Respiratory Effort: non-labored breathing    Cardiovascular  Extremities: no peripheral edema    Gastrointestinal  Abdominal Examination: abdomen non-distended, non-tender to palpation, no masses present  Liver and spleen: no hepatomegaly present, spleen not palpable  Hernias: no hernias identified    Genitourinary  External  Genitalia: normal appearance for age, no discharge present, no tenderness present, no inflammatory lesions present, no masses present, no atrophy present  Vagina: normal vaginal vault without central or paravaginal defects, no discharge present, no inflammatory lesions present, no masses present  Bladder: non-tender to  palpation  Urethra: appears normal  Cervix: normal IUD strings not visible   Uterus: normal size, shape and consistency  Adnexa: no adnexal tenderness present, no adnexal masses present  Perineum: perineum within normal limits, no evidence of trauma, no rashes or skin lesions present    Skin  General Inspection: no rash, no lesions identified    Neurologic/Psychiatric  Mental Status:  Orientation: grossly oriented to person, place and time  Mood and Affect: mood normal, affect appropriate      Assessment/Plan:    1. IUD check up      1. IUD check up  Doing well with the Mirena IUD so far. Discussed possible bleeding patterns.  IUD strings not seen today - check with [name] to eval IUD position.     [name] was able to be added on after her visit and reveals the IUD in the proper fundal position of the endometrial cavity. Reassured.  - [name] NON OB TRANSVAGINAL; Future    2. Right ovarian cyst  [name] images and findings reviewed today - she has two moderate sized simple right ovarian cysts.   Given the whole cyst complex measures about 8cm long, recommend follow-up in 2-3 months to check for resolution/progression.         Christoper Allegra, MD

## 2023-09-30 NOTE — Progress Notes (Signed)
Janet Alexander is a 33 y.o. female presents for a problem visit.    Patient is being seen for IUD check. Has been having spotting on and off. Unsure of LMP but possibly the first week of January.    Chief Complaint   Patient presents with    Follow-up     Patient's last menstrual period was 09/06/2023.  Birth Control: Mirena IUD 08/14/2023  Last Pap: 06/19/2023 ASCUS hpv neg      1. Have you been to the ER, urgent care clinic, or hospitalized since your last visit? No    2. Have you seen or consulted any other health care providers outside of the [facility] since your last visit? No    Examination chaperoned: declines     Alferd Patee, LPN.

## 2023-10-16 ENCOUNTER — Encounter

## 2023-10-16 NOTE — Telephone Encounter (Signed)
 Called pharmacy because patient should still have refills. The pharmacist stated they never received the script on 12/22. I gave the verbal from that script.     I called patient, let her know that the pharmacy was working on the refill now and got her scheduled for followup with labs.

## 2023-10-22 ENCOUNTER — Ambulatory Visit
Admit: 2023-10-22 | Discharge: 2023-10-22 | Payer: PRIVATE HEALTH INSURANCE | Attending: Family Medicine | Primary: Family Medicine

## 2023-10-22 ENCOUNTER — Other Ambulatory Visit: Admit: 2023-10-22 | Discharge: 2023-10-22 | Payer: PRIVATE HEALTH INSURANCE | Primary: Family Medicine

## 2023-10-22 VITALS — BP 105/67 | HR 80 | Temp 98.00000°F | Resp 20 | Ht 68.0 in | Wt 200.8 lb

## 2023-10-22 DIAGNOSIS — E039 Hypothyroidism, unspecified: Secondary | ICD-10-CM

## 2023-10-22 DIAGNOSIS — L232 Allergic contact dermatitis due to cosmetics: Secondary | ICD-10-CM

## 2023-10-22 MED ORDER — TRIAMCINOLONE ACETONIDE 0.1 % EX OINT
0.1 | Freq: Two times a day (BID) | CUTANEOUS | 0 refills | Status: AC
Start: 2023-10-22 — End: 2023-10-29

## 2023-10-22 NOTE — Progress Notes (Signed)
Identified pt with two pt identifiers(name and DOB)    Chief Complaint   Patient presents with    6 Month Follow-Up     Patient is here for 6 month follow up on thyroid     Rash     Patient has a rash on her face, and right side of her chest and shoulder that showed up a few weeks ago         Health Maintenance Due   Topic    Varicella vaccine (1 of 2 - 13+ 2-dose series)    HIV screen     Hepatitis C screen     Hepatitis B vaccine (1 of 3 - 19+ 3-dose series)    DTaP/Tdap/Td vaccine (1 - Tdap)    Flu vaccine (1)    COVID-19 Vaccine (2 - 2024-25 season)       Wt Readings from Last 3 Encounters:   09/30/23 93.9 kg (207 lb)   08/14/23 93.4 kg (206 lb)   08/07/23 93.9 kg (207 lb)     Temp Readings from Last 3 Encounters:   09/30/23 98.3 F (36.8 C) (Oral)   08/14/23 97.9 F (36.6 C) (Oral)   08/07/23 97.9 F (36.6 C) (Oral)     BP Readings from Last 3 Encounters:   09/30/23 96/62   08/14/23 94/60   08/07/23 99/64     Pulse Readings from Last 3 Encounters:   09/30/23 60   08/14/23 64   08/07/23 61           Depression Screening:  :         09/30/2023     2:43 PM 06/19/2023     9:20 AM 03/01/2023    10:28 AM   PHQ-9 Questionaire   Little interest or pleasure in doing things 0 0 1   Feeling down, depressed, or hopeless 0 0 1   Trouble falling or staying asleep, or sleeping too much   2   Feeling tired or having little energy   1   Poor appetite or overeating   0   Feeling bad about yourself - or that you are a failure or have let yourself or your family down   0   Trouble concentrating on things, such as reading the newspaper or watching television   1   Moving or speaking so slowly that other people could have noticed. Or the opposite - being so fidgety or restless that you have been moving around a lot more than usual   0   Thoughts that you would be better off dead, or of hurting yourself in some way   0   PHQ-9 Total Score 0 0 6   If you checked off any problems, how difficult have these problems made it for you to  do your work, take care of things at home, or get along with other people?   1        Fall Risk Assessment:  :          No data to display                 Abuse Screening:  :          No data to display                 Coordination of Care Questionnaire:  :     "Have you been to the ER, urgent care clinic since your last visit?  Hospitalized since your  last visit?"    NO    "Have you seen or consulted any other health care providers outside our system since your last visit?"    NO        Click Here for Release of Records Request      [place], [place]

## 2023-10-22 NOTE — Progress Notes (Signed)
Assessment & Plan   1. Allergic contact dermatitis due to cosmetics  -     triamcinolone (KENALOG) 0.1 % ointment; Apply topically 2 times daily for 7 days, Topical, 2 TIMES DAILY Starting Tue 10/22/2023, Until Tue 10/29/2023, For 7 days, Disp-80 g, R-0, Normal  2. Irritant contact dermatitis due to plants, except food  -     triamcinolone (KENALOG) 0.1 % ointment; Apply topically 2 times daily for 7 days, Topical, 2 TIMES DAILY Starting Tue 10/22/2023, Until Tue 10/29/2023, For 7 days, Disp-80 g, R-0, Normal  3. Acquired hypothyroidism  -     TSH; Future  -     T4; Future     Discontinue all products immediately. Use basic lotions and soaps and detergents all without fragrance.     Symptoms should resolve in 6-8 weeks.     No follow-ups on file.       Subjective   Janet Alexander (DOB:  1990-11-05) is a 33 y.o. female,Established patient, here for evaluation of the following chief complaint(s):  6 Month Follow-Up (Patient is here for 6 month follow up on thyroid ) and Rash (Patient has a rash on her face, and right side of her chest and shoulder that showed up a few weeks ago )      Janet Alexander is a 34 y.o. female presents as a follow up.     Has a rash on her face and upper right chest. Patient was using aloe from her aloe vera plant about 3 weeks ago, started noticing the rash within a day or two. She continued to use chemical peels, astringents, and face products.     She also developed a rash on her chest area right upper chest near the shoulder and one patch near her eye.     Rash  Pertinent negatives include no congestion, cough, diarrhea, fatigue, fever, rhinorrhea, shortness of breath, sore throat or vomiting.       Review of Systems   Constitutional:  Negative for activity change, appetite change, fatigue and fever.   HENT:  Negative for congestion, postnasal drip, rhinorrhea, sinus pressure, sinus pain, sneezing and sore throat.    Respiratory:  Negative for cough, chest tightness,  shortness of breath and wheezing.    Cardiovascular:  Negative for chest pain, palpitations and leg swelling.   Gastrointestinal:  Negative for abdominal distention, abdominal pain, anal bleeding, blood in stool, constipation, diarrhea, nausea and vomiting.   Endocrine: Negative for cold intolerance, heat intolerance, polydipsia, polyphagia and polyuria.   Genitourinary:  Negative for dyspareunia, genital sores, hematuria, menstrual problem, pelvic pain, vaginal bleeding, vaginal discharge and vaginal pain.   Musculoskeletal:  Negative for arthralgias, back pain, gait problem, joint swelling and neck pain.   Skin:  Positive for rash. Negative for pallor and wound.   Allergic/Immunologic: Negative for environmental allergies, food allergies and immunocompromised state.   Hematological:  Negative for adenopathy. Does not bruise/bleed easily.   Psychiatric/Behavioral:  Negative for behavioral problems, decreased concentration, dysphoric mood, self-injury, sleep disturbance and suicidal ideas. The patient is not nervous/anxious.           Objective   Physical Exam  Vitals and nursing note reviewed.   Constitutional:       Appearance: Normal appearance. She is normal weight.   HENT:      Head: Normocephalic and atraumatic.      Right Ear: Tympanic membrane, ear canal and external ear normal.      Left Ear: Tympanic  membrane, ear canal and external ear normal.      Nose: Nose normal.      Mouth/Throat:      Mouth: Mucous membranes are moist.      Pharynx: Oropharynx is clear.   Eyes:      Extraocular Movements: Extraocular movements intact.      Conjunctiva/sclera: Conjunctivae normal.      Pupils: Pupils are equal, round, and reactive to light.   Cardiovascular:      Rate and Rhythm: Normal rate and regular rhythm.      Pulses: Normal pulses.      Heart sounds: Normal heart sounds.   Pulmonary:      Effort: Pulmonary effort is normal.      Breath sounds: Normal breath sounds.   Abdominal:      General: Abdomen is flat.  Bowel sounds are normal.      Palpations: Abdomen is soft.   Musculoskeletal:         General: Normal range of motion.      Cervical back: Normal range of motion and neck supple.   Skin:     General: Skin is warm and dry.      Capillary Refill: Capillary refill takes less than 2 seconds.   Neurological:      General: No focal deficit present.      Mental Status: She is alert and oriented to person, place, and time. Mental status is at baseline.   Psychiatric:         Mood and Affect: Mood normal.         Behavior: Behavior normal.         Thought Content: Thought content normal.         Judgment: Judgment normal.                  An electronic signature was used to authenticate this note.    --Aida Raider, MD

## 2023-10-23 LAB — TSH: TSH, 3rd Generation: 7.25 u[IU]/mL — ABNORMAL HIGH (ref 0.36–3.74)

## 2023-10-23 LAB — T4: Thyroxine (T4): 10.7 ug/dL (ref 4.8–13.9)

## 2023-11-28 ENCOUNTER — Encounter

## 2023-11-29 ENCOUNTER — Ambulatory Visit: Admit: 2023-11-29 | Payer: BLUE CROSS/BLUE SHIELD | Primary: Family Medicine

## 2023-11-29 ENCOUNTER — Ambulatory Visit
Admit: 2023-11-29 | Discharge: 2023-11-29 | Payer: BLUE CROSS/BLUE SHIELD | Attending: Obstetrics & Gynecology | Primary: Family Medicine

## 2023-11-29 VITALS — BP 98/63 | HR 76 | Temp 98.00000°F | Ht 68.0 in | Wt 204.0 lb

## 2023-11-29 DIAGNOSIS — N83201 Unspecified ovarian cyst, right side: Secondary | ICD-10-CM

## 2023-11-29 NOTE — Progress Notes (Signed)
 Problem Visit    Janet Alexander is a 33 y.o.  presenting for problem visit.     Her main concern today is follow-up of a right ovarian cyst.     She had two episodes of pain in the right pelvis/RLQ since her last visit. The pain lasted less than an hour, but was notable.      Menses are still much lighter than before her Mirena IUD placement.  Very satisfied with the Mirena IUD!        Past Medical History:   Diagnosis Date    ADHD (attention deficit hyperactivity disorder) 09/2023    Anxiety     Asthma     Hypothyroidism     Iron deficiency anemia     Obesity     Postpartum depression 2016    VBAC (vaginal birth after Cesarean) 01/11/15       Past Surgical History:   Procedure Laterality Date    BARIATRIC SURGERY      CERVICAL CERCLAGE  2012    another in 2015    CESAREAN SECTION  2012    COLONOSCOPY  03/2023    GASTRIC BYPASS SURGERY  02/2021       Family History   Problem Relation Age of Onset    Diabetes Mother     Hypertension Mother     Scoliosis Mother     Obesity Mother     COPD Father     Colon Cancer Father     Alcohol Abuse Father     Hypertension Maternal Grandmother     Heart Disease Maternal Grandmother     High Blood Pressure Maternal Grandmother     Asthma Sister     Obesity Sister        Social History     Socioeconomic History    Marital status: Married     Spouse name: Not on file    Number of children: Not on file    Years of education: Not on file    Highest education level: Not on file   Occupational History    Not on file   Tobacco Use    Smoking status: Never     Passive exposure: Never    Smokeless tobacco: Never   Vaping Use    Vaping status: Never Used   Substance and Sexual Activity    Alcohol use: Yes     Comment: I dont drink weekly, maybe 1-2 times a month    Drug use: Yes     Frequency: 5.0 times per week     Types: Marijuana Sheran Fava)    Sexual activity: Yes     Partners: Male   Other Topics Concern    Not on file   Social History Narrative    ** Merged History Encounter **           Social Drivers of Health     Financial Resource Strain: Low Risk  (06/19/2023)    Overall Financial Resource Strain (CARDIA)     Difficulty of Paying Living Expenses: Not hard at all   Food Insecurity: No Food Insecurity (09/30/2023)    Hunger Vital Sign     Worried About Running Out of Food in the Last Year: Never true     Ran Out of Food in the Last Year: Never true   Transportation Needs: No Transportation Needs (09/30/2023)    PRAPARE - Transportation     Lack of Transportation (Medical): No     Lack  of Transportation (Non-Medical): No   Physical Activity: Not on file   Stress: Not on file   Social Connections: Unknown (01/15/2022)    Received from [facility]    Social Network     Social Network: Not on file   Intimate Partner Violence: Unknown (12/07/2021)    Received from [facility]    HITS     Physically Hurt: Not on file     Insult or Talk Down To: Not on file     Threaten Physical Harm: Not on file     Scream or Curse: Not on file   Housing Stability: Low Risk  (09/30/2023)    Housing Stability Vital Sign     Unable to Pay for Housing in the Last Year: No     Number of Times Moved in the Last Year: 0     Homeless in the Last Year: No        Current Outpatient Medications   Medication Sig Dispense Refill    buPROPion (WELLBUTRIN XL) 150 MG extended release tablet Take 1 tablet by mouth every morning      levonorgestrel (MIRENA) IUD 52 mg       levothyroxine (SYNTHROID) 125 MCG tablet TAKE 1 TABLET BY MOUTH EVERY DAY 90 tablet 1    levocetirizine (XYZAL) 5 MG tablet TAKE 1 TABLET BY MOUTH EVERY MORNING AND TAKE ONE TABLET BY MOUTH AT BEDTIME      AIRSUPRA 90-80 MCG/ACT AERO Inhale 2 puffs into the lungs every 4 hours as needed      buPROPion (WELLBUTRIN) 100 MG tablet TAKE 1 TABLET BY MOUTH EVERY DAY AT 800 AM. AND TAKE 1 TABLET BY MOUTH EVERY DAY AT 100 PM. (Patient not taking: Reported on 11/29/2023)       No current facility-administered medications for this visit.       Allergies   Allergen Reactions     Cat Hair Extract Cough, Itching and Shortness Of Breath    Molds & Smuts Shortness Of Breath    Cat Dander Other (See Comments)    Dust Mite Extract Rash       Review of Systems - History obtained from the patient  Constitutional: negative for weight loss, fever, night sweats  HEENT: negative for hearing loss, earache, congestion, snoring, sorethroat  CV: negative for chest pain, palpitations, edema  Resp: negative for cough, shortness of breath, wheezing  GI: negative for change in bowel habits, abdominal pain, black or bloody stools  GU: negative for frequency, dysuria, hematuria, vaginal discharge  MSK: negative for back pain, joint pain, muscle pain  Breast: negative for breast lumps, nipple discharge, galactorrhea  Skin :negative for itching, rash, hives  Neuro: negative for dizziness, headache, confusion, weakness  Psych: negative for anxiety, depression, change in mood  Heme/lymph: negative for bleeding, bruising, pallor    Physical Exam    BP 98/63   Pulse 76   Temp 98 F (36.7 C) (Oral)   Ht 1.727 m (5\' 8" )   Wt 92.5 kg (204 lb)   LMP 11/08/2023   BMI 31.02 kg/m       OBGyn Exam      Constitutional  Appearance: well-nourished, well developed, alert, in no acute distress    HENT  Head and Face: appears normal    Neck  Inspection/Palpation: normal appearance, no masses or tenderness  Thyroid: gland size normal, nontender    Chest  Respiratory Effort: non-labored breathing    Cardiovascular  Extremities: no peripheral edema  Gastrointestinal  Abdominal Examination: abdomen non-distended, non-tender to palpation, no masses present  Liver and spleen: no hepatomegaly present, spleen not palpable  Hernias: no hernias identified      Skin  General Inspection: no rash, no lesions identified    Neurologic/Psychiatric  Mental Status:  Orientation: grossly oriented to person, place and time  Mood and Affect: mood normal, affect appropriate      Assessment/Plan:    1. Right ovarian cyst  Reviewed her  ultrasound images together today. Resolution of one of her right ovarian cysts, continued presence of the other which is stable in size.   Dicussed management options of expectant management with continued surveillance versus surgical management with laparoscopy if she becomes symptomatic. For now, we plan for expectant management.   Return in 6 months for [name] follow-up.        Christoper Allegra, MD

## 2023-11-29 NOTE — Progress Notes (Signed)
 Janet Alexander is a 33 y.o. female presents for a problem visit.    Patient being seen for [name] & visit with MD to follow up for cyst. Relays that she has had two episodes where she had a sharp pain on the right side that did go away. Has not had the pain in a couple of weeks.    Chief Complaint   Patient presents with    Follow-up     G5P2    Patient's last menstrual period was 11/08/2023. Regular, last 5 days  Birth Control: Mirena IUD 08/14/2023  Last Pap: 06/19/2023 ASCUS, HPV neg      1. Have you been to the ER, urgent care clinic, or hospitalized since your last visit? No    2. Have you seen or consulted any other health care providers outside of the [facility] since your last visit? No    Examination chaperoned:     Alferd Patee, LPN.

## 2024-04-03 ENCOUNTER — Other Ambulatory Visit: Admit: 2024-04-03 | Discharge: 2024-04-03 | Payer: BLUE CROSS/BLUE SHIELD | Primary: Family Medicine

## 2024-04-03 ENCOUNTER — Ambulatory Visit
Admit: 2024-04-03 | Discharge: 2024-04-03 | Payer: BLUE CROSS/BLUE SHIELD | Attending: Family Medicine | Primary: Family Medicine

## 2024-04-03 VITALS — BP 110/72 | HR 71 | Temp 97.70000°F | Resp 20 | Ht 68.5 in | Wt 185.4 lb

## 2024-04-03 DIAGNOSIS — Z0000 Encounter for general adult medical examination without abnormal findings: Principal | ICD-10-CM

## 2024-04-03 DIAGNOSIS — Z13220 Encounter for screening for lipoid disorders: Principal | ICD-10-CM

## 2024-04-03 NOTE — Progress Notes (Unsigned)
 Identified pt with two pt identifiers(name and DOB)    Chief Complaint   Patient presents with    Annual Exam     Patient had labs completed before appointment     Bladder Problem     Patient feels like she may have overactive bladder and would like to discuss         Health Maintenance Due   Topic    Varicella vaccine (1 of 2 - 13+ 2-dose series)    HIV screen     Hepatitis C screen     Hepatitis B vaccine (1 of 3 - 19+ 3-dose series)    DTaP/Tdap/Td vaccine (1 - Tdap)    COVID-19 Vaccine (2 - 2024-25 season)    GFR test (Diabetes, CKD 3-4, OR last GFR 15-59)     Flu vaccine (1)       Wt Readings from Last 3 Encounters:   11/29/23 92.5 kg (204 lb)   10/22/23 91.1 kg (200 lb 12.8 oz)   09/30/23 93.9 kg (207 lb)     Temp Readings from Last 3 Encounters:   11/29/23 98 F (36.7 C) (Oral)   10/22/23 98 F (36.7 C) (Temporal)   09/30/23 98.3 F (36.8 C) (Oral)     BP Readings from Last 3 Encounters:   11/29/23 98/63   10/22/23 105/67   09/30/23 96/62     Pulse Readings from Last 3 Encounters:   11/29/23 76   10/22/23 80   09/30/23 60           Depression Screening:  :         09/30/2023     2:43 PM 06/19/2023     9:20 AM 03/01/2023    10:28 AM   PHQ-9 Questionaire   Little interest or pleasure in doing things 0 0 1   Feeling down, depressed, or hopeless 0 0 1   Trouble falling or staying asleep, or sleeping too much   2   Feeling tired or having little energy   1   Poor appetite or overeating   0   Feeling bad about yourself - or that you are a failure or have let yourself or your family down   0   Trouble concentrating on things, such as reading the newspaper or watching television   1   Moving or speaking so slowly that other people could have noticed. Or the opposite - being so fidgety or restless that you have been moving around a lot more than usual   0   Thoughts that you would be better off dead, or of hurting yourself in some way   0   PHQ-9 Total Score 0 0 6   If you checked off any problems, how difficult have  these problems made it for you to do your work, take care of things at home, or get along with other people?   1        Fall Risk Assessment:  :          No data to display                 Abuse Screening:  :          No data to display                 Coordination of Care Questionnaire:  :     Have you been to the ER, urgent care clinic since your last visit?  Hospitalized  since your last visit?    NO    "Have you seen or consulted any other health care providers outside our system since your last visit?"    NO        Click Here for Release of Records Request      [place], [place]

## 2024-04-04 LAB — LIPID PANEL
Chol/HDL Ratio: 2
Cholesterol, Total: 148 mg/dL (ref 0–200)
HDL: 75 mg/dL — ABNORMAL HIGH (ref 40–60)
LDL Cholesterol: 66 mg/dL
Triglycerides: 33 mg/dL (ref 0–150)
VLDL Cholesterol Calculated: 7 mg/dL

## 2024-04-04 LAB — CBC WITH AUTO DIFFERENTIAL
Basophils %: 1.2 % — ABNORMAL HIGH (ref 0.0–1.0)
Basophils Absolute: 0.06 K/UL (ref 0.00–0.10)
Eosinophils %: 2.9 % (ref 0.0–7.0)
Eosinophils Absolute: 0.14 K/UL (ref 0.00–0.40)
Hematocrit: 41.9 % (ref 35.0–47.0)
Hemoglobin: 13.4 g/dL (ref 11.5–16.0)
Immature Granulocytes %: 0.2 % (ref 0.0–0.5)
Immature Granulocytes Absolute: 0.01 K/UL (ref 0.00–0.04)
Lymphocytes %: 31.7 % (ref 12.0–49.0)
Lymphocytes Absolute: 1.53 K/UL (ref 0.80–3.50)
MCH: 30.5 pg (ref 26.0–34.0)
MCHC: 32 g/dL (ref 30.0–36.5)
MCV: 95.2 FL (ref 80.0–99.0)
MPV: 10.8 FL (ref 8.9–12.9)
Monocytes %: 7.5 % (ref 5.0–13.0)
Monocytes Absolute: 0.36 K/UL (ref 0.00–1.00)
Neutrophils %: 56.5 % (ref 32.0–75.0)
Neutrophils Absolute: 2.73 K/UL (ref 1.80–8.00)
Nucleated RBCs: 0 /100{WBCs}
Platelets: 263 K/uL (ref 150–400)
RBC: 4.4 M/uL (ref 3.80–5.20)
RDW: 12.6 % (ref 11.5–14.5)
WBC: 4.8 K/uL (ref 3.6–11.0)
nRBC: 0 K/uL (ref 0.00–0.01)

## 2024-04-04 LAB — COMPREHENSIVE METABOLIC PANEL
ALT: 19 U/L (ref 10–35)
AST: 23 U/L (ref 10–35)
Albumin/Globulin Ratio: 1.7 (ref 1.1–2.2)
Albumin: 4.5 g/dL (ref 3.5–5.2)
Alk Phosphatase: 48 U/L (ref 35–104)
Anion Gap: 9 mmol/L (ref 2–14)
BUN/Creatinine Ratio: 13 (ref 12–20)
BUN: 11 mg/dL (ref 6–20)
CO2: 27 mmol/L (ref 20–29)
Calcium: 9.4 mg/dL (ref 8.6–10.0)
Chloride: 102 mmol/L (ref 98–107)
Creatinine: 0.81 mg/dL (ref 0.60–1.00)
Est, Glom Filt Rate: >90 ml/min/1.73m2 (ref >90)
Globulin: 2.6 g/dL (ref 2.0–4.0)
Glucose: 80 mg/dL (ref 65–100)
Potassium: 3.7 mmol/L (ref 3.5–5.1)
Sodium: 138 mmol/L (ref 136–145)
Total Bilirubin: 0.5 mg/dL (ref 0.0–1.2)
Total Protein: 7.1 g/dL (ref 6.4–8.3)

## 2024-04-04 LAB — HEMOGLOBIN A1C
Estimated Avg Glucose: 104 mg/dL
Hemoglobin A1C: 5.2 % (ref 4.0–5.6)

## 2024-04-04 LAB — TSH: TSH, 3rd Generation: 6.09 u[IU]/mL — ABNORMAL HIGH (ref 0.270–4.200)

## 2024-04-04 LAB — VITAMIN D 25 HYDROXY: Vit D, 25-Hydroxy: 52.3 ng/mL (ref 30.0–100.0)

## 2024-04-07 DIAGNOSIS — Z0000 Encounter for general adult medical examination without abnormal findings: Secondary | ICD-10-CM

## 2024-04-07 NOTE — Progress Notes (Signed)
 Well Adult Note  Name: Janet Alexander Today's Date: 04/07/2024   MRN: [id number] Sex: Female   Age: 33 y.o. Ethnicity: Non-Hispanic / Non Latino   DOB: 1990/10/06 Race: White (non-Hispanic)      Janet Alexander is here for a well adult exam.       Assessment & Plan   Encounter for well adult exam without abnormal findings  Acquired hypothyroidism  -     TSH; Future  Hypovitaminosis D  Screening for lipid disorders  -     Lipid Panel; Future  Screening for deficiency anemia  -     CBC with Auto Differential; Future  Encounter for screening examination for impaired glucose regulation and diabetes mellitus  -     Comprehensive Metabolic Panel; Future  -     Hemoglobin A1C; Future  Urge incontinence of urine  -     Culture, Urine; Future  Given a hand out see AVS      Return in 1 year (on 04/03/2025) for CPE (Physical Exam).       Subjective   History:  Janet Alexander is a 33 y.o. female presents as a follow up, with complaints of urinary incontinence, mostly when she holds her urine for prolonged periods of time. When she has to go, she has to rush to the restroom, otherwise, she has accidents.       Review of Systems   Constitutional:  Negative for activity change, appetite change, fatigue and fever.   HENT:  Negative for congestion, postnasal drip, rhinorrhea, sinus pressure, sinus pain, sneezing and sore throat.    Respiratory:  Negative for cough, chest tightness, shortness of breath and wheezing.    Cardiovascular:  Negative for chest pain, palpitations and leg swelling.   Gastrointestinal:  Negative for abdominal distention, abdominal pain, anal bleeding, blood in stool, constipation, diarrhea, nausea and vomiting.   Endocrine: Negative for cold intolerance, heat intolerance, polydipsia, polyphagia and polyuria.   Genitourinary:  Negative for dyspareunia, genital sores, hematuria, menstrual problem, pelvic pain, vaginal bleeding, vaginal discharge and vaginal pain.   Musculoskeletal:  Negative for  arthralgias, back pain, gait problem, joint swelling and neck pain.   Skin:  Negative for pallor, rash and wound.   Allergic/Immunologic: Negative for environmental allergies, food allergies and immunocompromised state.   Hematological:  Negative for adenopathy. Does not bruise/bleed easily.   Psychiatric/Behavioral:  Negative for behavioral problems, decreased concentration, dysphoric mood, self-injury, sleep disturbance and suicidal ideas. The patient is not nervous/anxious.        Allergies   Allergen Reactions    Cat Hair Extract Cough, Itching and Shortness Of Breath    Molds & Smuts Shortness Of Breath    Cat Dander Other (See Comments)    Dust Mite Extract Rash     Prior to Visit Medications    Medication Sig Taking? Authorizing Provider   buPROPion (WELLBUTRIN XL) 150 MG extended release tablet Take 1 tablet by mouth every morning Yes [provider]   levonorgestrel  (MIRENA ) IUD 52 mg  Yes [provider]   levothyroxine  (SYNTHROID ) 125 MCG tablet TAKE 1 TABLET BY MOUTH EVERY DAY Yes Sonda Ian RAMAN, MD   levocetirizine (XYZAL) 5 MG tablet TAKE 1 TABLET BY MOUTH EVERY MORNING AND TAKE ONE TABLET BY MOUTH AT BEDTIME Yes [provider]   AIRSUPRA 90-80 MCG/ACT AERO Inhale 2 puffs into the lungs every 4 hours as needed  [provider]     Past Medical History:  Diagnosis Date    ADHD (attention deficit hyperactivity disorder) 09/2023    Anxiety     Asthma     Hypothyroidism     Iron deficiency anemia     Obesity     Postpartum depression 2016    VBAC (vaginal birth after Cesarean) 01/11/15     Past Surgical History:   Procedure Laterality Date    BARIATRIC SURGERY      CERVICAL CERCLAGE  2012    another in 2015    CESAREAN SECTION  2012    COLONOSCOPY  03/2023    GASTRIC BYPASS SURGERY  02/2021     Family History   Problem Relation Age of Onset    Diabetes Mother     Hypertension Mother     Scoliosis Mother     Obesity Mother     COPD Father     Colon Cancer Father      Alcohol Abuse Father     Hypertension Maternal Grandmother     Heart Disease Maternal Grandmother     High Blood Pressure Maternal Grandmother     Asthma Sister     Obesity Sister      Social History     Tobacco Use    Smoking status: Never     Passive exposure: Never    Smokeless tobacco: Never   Vaping Use    Vaping status: Never Used   Substance Use Topics    Alcohol use: Yes     Comment: I dont drink weekly, maybe 1-2 times a month    Drug use: Yes     Frequency: 5.0 times per week     Types: Marijuana (Weed)           Objective    Vital Signs  BP 110/72 (BP Site: Right Upper Arm, Patient Position: Sitting, BP Cuff Size: Medium Adult)   Pulse 71   Temp 97.7 F (36.5 C) (Temporal)   Resp 20   Ht 1.74 m (5' 8.5)   Wt 84.1 kg (185 lb 6.4 oz)   LMP 03/20/2024 (Approximate)   SpO2 98%   BMI 27.78 kg/m     Wt Readings from Last 3 Encounters:   04/03/24 84.1 kg (185 lb 6.4 oz)   11/29/23 92.5 kg (204 lb)   10/22/23 91.1 kg (200 lb 12.8 oz)       Physical Exam  Vitals and nursing note reviewed.   Constitutional:       Appearance: Normal appearance. She is normal weight.   HENT:      Head: Normocephalic and atraumatic.      Right Ear: Tympanic membrane, ear canal and external ear normal.      Left Ear: Tympanic membrane, ear canal and external ear normal.      Nose: Nose normal.      Mouth/Throat:      Mouth: Mucous membranes are moist.      Pharynx: Oropharynx is clear.   Eyes:      Extraocular Movements: Extraocular movements intact.      Conjunctiva/sclera: Conjunctivae normal.      Pupils: Pupils are equal, round, and reactive to light.   Cardiovascular:      Rate and Rhythm: Normal rate and regular rhythm.      Pulses: Normal pulses.      Heart sounds: Normal heart sounds.   Pulmonary:      Effort: Pulmonary effort is normal.      Breath sounds: Normal breath sounds.  Abdominal:      General: Abdomen is flat. Bowel sounds are normal.      Palpations: Abdomen is soft.   Musculoskeletal:         General:  Normal range of motion.      Cervical back: Normal range of motion and neck supple.   Skin:     General: Skin is warm and dry.      Capillary Refill: Capillary refill takes less than 2 seconds.   Neurological:      General: No focal deficit present.      Mental Status: She is alert and oriented to person, place, and time. Mental status is at baseline.   Psychiatric:         Mood and Affect: Mood normal.         Behavior: Behavior normal.         Thought Content: Thought content normal.         Judgment: Judgment normal.             Personalized Preventive Plan  Current Health Maintenance Status    There is no immunization history on file for this patient.     Health Maintenance Due   Topic Date Due    Varicella vaccine (1 of 2 - 13+ 2-dose series) Never done    HIV screen  Never done    Hepatitis C screen  Never done    Hepatitis B vaccine (1 of 3 - 19+ 3-dose series) Never done    DTaP/Tdap/Td vaccine (1 - Tdap) Never done    COVID-19 Vaccine (2 - 2024-25 season) 05/05/2023    Flu vaccine (1) Never done      Recommendations for Preventive Services Due: see orders and patient instructions/AVS.

## 2024-04-07 NOTE — Patient Instructions (Addendum)
 Well Visit, Ages 85 to 72: Care Instructions  Well visits can help you stay healthy. Your doctor has checked your overall health and may have suggested ways to take good care of yourself. Your doctor also may have recommended tests. You can help prevent illness with healthy eating, good sleep, vaccinations, regular exercise, and other steps.    Get the tests that you and your doctor decide on. Depending on your age and risks, examples might include screening for diabetes; hepatitis C; HIV; and cervical, breast, lung, and colon cancer. Screening helps find diseases before any symptoms appear.   Eat healthy foods. Choose fruits, vegetables, whole grains, lean protein, and low-fat dairy foods. Limit saturated fat and reduce salt.     Limit alcohol. Men should have no more than 2 drinks a day. Women should have no more than 1. For some people, no alcohol is the best choice.   Exercise. Get at least 30 minutes of exercise on most days of the week. Walking can be a good choice.     Reach and stay at your healthy weight. This will lower your risk for many health problems.   Take care of your mental health. Try to stay connected with friends, family, and community, and find ways to manage stress.     If you're feeling depressed or hopeless, talk to someone. A counselor can help. If you don't have a counselor, talk to your doctor.   Talk to your doctor if you think you may have a problem with alcohol or drug use. This includes prescription medicines, marijuana, and other drugs.     Avoid tobacco and nicotine : Don't smoke, vape, or chew. If you need help quitting, talk to your doctor.   Practice safer sex. Getting tested, using condoms or dental dams, and limiting sex partners can help prevent STIs.     Use birth control if it's important to you to prevent pregnancy. Talk with your doctor about your choices and what might be best for you.   Prevent problems where you can. Protect your skin from too much sun, wash your  hands, brush your teeth twice a day, and wear a seat belt in the car.   Where can you learn more?  Go to [employer] and enter P072 to learn more about "Well Visit, Ages 24 to 50: Care Instructions."  Current as of: January 01, 2023  Content Version: 14.5   2024-2025 [place], [place].   Care instructions adapted under license by [facility] If you have questions about a medical condition or this instruction, always ask your healthcare professional. Laren Player, [facility], disclaims any warranty or liability for your use of this information.         Well Visit, Ages 35 to 63: Care Instructions  Well visits can help you stay healthy. Your doctor has checked your overall health and may have suggested ways to take good care of yourself. Your doctor also may have recommended tests. You can help prevent illness with healthy eating, good sleep, vaccinations, regular exercise, and other steps.    Get the tests that you and your doctor decide on. Depending on your age and risks, examples might include screening for diabetes; hepatitis C; HIV; and cervical, breast, lung, and colon cancer. Screening helps find diseases before any symptoms appear.   Eat healthy foods. Choose fruits, vegetables, whole grains, lean protein, and low-fat dairy foods. Limit saturated fat and reduce salt.     Limit alcohol. Men should have no more  than 2 drinks a day. Women should have no more than 1. For some people, no alcohol is the best choice.   Exercise. Get at least 30 minutes of exercise on most days of the week. Walking can be a good choice.     Reach and stay at your healthy weight. This will lower your risk for many health problems.   Take care of your mental health. Try to stay connected with friends, family, and community, and find ways to manage stress.     If you're feeling depressed or hopeless, talk to someone. A counselor can help. If you don't have a counselor, talk to your doctor.   Talk to your  doctor if you think you may have a problem with alcohol or drug use. This includes prescription medicines, marijuana, and other drugs.     Avoid tobacco and nicotine : Don't smoke, vape, or chew. If you need help quitting, talk to your doctor.   Practice safer sex. Getting tested, using condoms or dental dams, and limiting sex partners can help prevent STIs.     Use birth control if it's important to you to prevent pregnancy. Talk with your doctor about your choices and what might be best for you.   Prevent problems where you can. Protect your skin from too much sun, wash your hands, brush your teeth twice a day, and wear a seat belt in the car.   Where can you learn more?  Go to [employer] and enter P072 to learn more about "Well Visit, Ages 29 to 70: Care Instructions."  Current as of: January 01, 2023  Content Version: 14.5   2024-2025 [place], [place].   Care instructions adapted under license by [facility]. If you have questions about a medical condition or this instruction, always ask your healthcare professional. Laren Player, [facility], disclaims any warranty or liability for your use of this information.

## 2024-04-07 NOTE — Other (Signed)
 Good morning Janet Alexander,     Your blood work confirms that your thyroid levels are not well controlled. I'd like to discuss this with you virtually at your earliest convenience.     Sincerely,     Dr. Sonda

## 2024-04-10 ENCOUNTER — Encounter

## 2024-04-10 MED ORDER — LEVOTHYROXINE SODIUM 125 MCG PO TABS
125 | ORAL_TABLET | Freq: Every day | ORAL | 1 refills | 90.00000 days | Status: DC
Start: 2024-04-10 — End: 2024-06-10

## 2024-04-28 ENCOUNTER — Encounter: Payer: BLUE CROSS/BLUE SHIELD | Attending: Family Medicine | Primary: Family Medicine

## 2024-04-30 ENCOUNTER — Telehealth
Admit: 2024-04-30 | Discharge: 2024-04-30 | Payer: BLUE CROSS/BLUE SHIELD | Attending: Family Medicine | Primary: Family Medicine

## 2024-04-30 MED ORDER — LEVOTHYROXINE SODIUM 150 MCG PO TABS
150 | ORAL_TABLET | Freq: Every day | ORAL | 0 refills | 90.00000 days | Status: DC
Start: 2024-04-30 — End: 2024-07-20

## 2024-04-30 NOTE — Progress Notes (Unsigned)
 Identified pt with two pt identifiers(name and DOB)    Chief Complaint   Patient presents with    Discuss Labs        Health Maintenance Due   Topic    Varicella vaccine (1 of 2 - 13+ 2-dose series)    HIV screen     Hepatitis C screen     Hepatitis B vaccine (1 of 3 - 19+ 3-dose series)    DTaP/Tdap/Td vaccine (1 - Tdap)    COVID-19 Vaccine (2 - 2024-25 season)    Flu vaccine (1)       Wt Readings from Last 3 Encounters:   04/03/24 84.1 kg (185 lb 6.4 oz)   11/29/23 92.5 kg (204 lb)   10/22/23 91.1 kg (200 lb 12.8 oz)     Temp Readings from Last 3 Encounters:   04/03/24 97.7 F (36.5 C) (Temporal)   11/29/23 98 F (36.7 C) (Oral)   10/22/23 98 F (36.7 C) (Temporal)     BP Readings from Last 3 Encounters:   04/03/24 110/72   11/29/23 98/63   10/22/23 105/67     Pulse Readings from Last 3 Encounters:   04/03/24 71   11/29/23 76   10/22/23 80           Depression Screening:  :         09/30/2023     2:43 PM 06/19/2023     9:20 AM 03/01/2023    10:28 AM   PHQ-9 Questionaire   Little interest or pleasure in doing things 0 0 1   Feeling down, depressed, or hopeless 0 0 1   Trouble falling or staying asleep, or sleeping too much   2   Feeling tired or having little energy   1   Poor appetite or overeating   0   Feeling bad about yourself - or that you are a failure or have let yourself or your family down   0   Trouble concentrating on things, such as reading the newspaper or watching television   1   Moving or speaking so slowly that other people could have noticed. Or the opposite - being so fidgety or restless that you have been moving around a lot more than usual   0   Thoughts that you would be better off dead, or of hurting yourself in some way   0   PHQ-9 Total Score 0  0  6    If you checked off any problems, how difficult have these problems made it for you to do your work, take care of things at home, or get along with other people?   1       Data saved with a previous flowsheet row definition        Fall Risk  Assessment:  :          No data to display                 Abuse Screening:  :          No data to display                 Coordination of Care Questionnaire:  :     Have you been to the ER, urgent care clinic since your last visit?  Hospitalized since your last visit?    NO    "Have you seen or consulted any other health care providers outside our system since your last visit?"  NO        Click Here for Release of Records Request      [place], [place]

## 2024-05-01 NOTE — Progress Notes (Signed)
 Janet Alexander, was evaluated through a synchronous (real-time) audio-video encounter. The patient (or guardian if applicable) is aware that this is a billable service, which includes applicable co-pays. This Virtual Visit was conducted with patient's (and/or legal guardian's) consent. Patient identification was verified, and a caregiver was present when appropriate.   The patient was located at Home: [address] [place] [address]  Provider was located at [employer] (Appt Dept): [address]  [address]  [place],  [place] [address]  Confirm you are appropriately licensed, registered, or certified to deliver care in the state where the patient is located as indicated above. If you are not or unsure, please re-schedule the visit: Yes, I confirm.     Janet Alexander (DOB:  Aug 07, 1991) is a Established patient, presenting virtually for evaluation of the following:      Below is the assessment and plan developed based on review of pertinent history, physical exam, labs, studies, and medications.     Assessment & Plan  Acquired hypothyroidism       Orders:    TSH; Future    T4; Future      No follow-ups on file.       Subjective   Janet Alexander is a 33 y.o. female presents to discuss medications, her thyroid levels are uncontrolled. We discuss increasing her levothyroxine .       Review of Systems   Constitutional:  Negative for activity change, appetite change, fatigue and fever.   HENT:  Negative for congestion, postnasal drip, rhinorrhea, sinus pressure, sinus pain, sneezing and sore throat.    Respiratory:  Negative for cough, chest tightness, shortness of breath and wheezing.    Cardiovascular:  Negative for chest pain, palpitations and leg swelling.   Gastrointestinal:  Negative for abdominal distention, abdominal pain, anal bleeding, blood in stool, constipation, diarrhea, nausea and vomiting.   Endocrine: Negative for cold intolerance, heat intolerance, polydipsia,  polyphagia and polyuria.   Genitourinary:  Negative for dyspareunia, genital sores, hematuria, menstrual problem, pelvic pain, vaginal bleeding, vaginal discharge and vaginal pain.   Musculoskeletal:  Negative for arthralgias, back pain, gait problem, joint swelling and neck pain.   Skin:  Negative for pallor, rash and wound.   Allergic/Immunologic: Negative for environmental allergies, food allergies and immunocompromised state.   Hematological:  Negative for adenopathy. Does not bruise/bleed easily.   Psychiatric/Behavioral:  Negative for behavioral problems, decreased concentration, dysphoric mood, self-injury, sleep disturbance and suicidal ideas. The patient is not nervous/anxious.           Objective   Patient-Reported Vitals  No data recorded     Physical Exam  [INSTRUCTIONS:  [x]  Indicates a positive item  []  Indicates a negative item  -- DELETE ALL ITEMS NOT EXAMINED]    Constitutional: [x]  Appears well-developed and well-nourished [x]  No apparent distress      []  Abnormal -     Mental status: [x]  Alert and awake  [x]  Oriented to person/place/time [x]  Able to follow commands    []  Abnormal -     Eyes:   EOM    [x]   Normal    []  Abnormal -   Sclera  [x]   Normal    []  Abnormal -          Discharge [x]   None visible   []  Abnormal -     HENT: [x]  Normocephalic, atraumatic  []  Abnormal -   [x]  Mouth/Throat: Mucous membranes are moist  External Ears [x]  Normal  []  Abnormal -    Neck: [x]  No visualized mass []  Abnormal -     Pulmonary/Chest: [x]  Respiratory effort normal   [x]  No visualized signs of difficulty breathing or respiratory distress        []  Abnormal -      Musculoskeletal:   [x]  Normal gait with no signs of ataxia         [x]  Normal range of motion of neck        []  Abnormal -     Neurological:        [x]  No Facial Asymmetry (Cranial nerve 7 motor function) (limited exam due to video visit)          [x]  No gaze palsy        []  Abnormal -          Skin:        [x]  No significant exanthematous  lesions or discoloration noted on facial skin         []  Abnormal -            Psychiatric:       [x]  Normal Affect []  Abnormal -        [x]  No Hallucinations    Other pertinent observable physical exam findings:-             --Ian GORMAN Hals, MD

## 2024-05-01 NOTE — Assessment & Plan Note (Signed)
 Orders:    TSH; Future    T4; Future

## 2024-05-28 ENCOUNTER — Other Ambulatory Visit: Payer: Self-pay | Admitting: Medical Genetics

## 2024-05-28 ENCOUNTER — Ambulatory Visit: Payer: BC Managed Care – PPO | Admitting: Allergy & Immunology

## 2024-05-29 ENCOUNTER — Encounter

## 2024-06-01 ENCOUNTER — Encounter: Payer: BLUE CROSS/BLUE SHIELD | Attending: Obstetrics & Gynecology | Primary: Family Medicine

## 2024-06-01 ENCOUNTER — Ambulatory Visit: Payer: BLUE CROSS/BLUE SHIELD | Attending: Obstetrics & Gynecology | Primary: Family Medicine

## 2024-06-01 ENCOUNTER — Ambulatory Visit: Payer: BLUE CROSS/BLUE SHIELD | Primary: Family Medicine

## 2024-06-10 ENCOUNTER — Ambulatory Visit: Admit: 2024-06-10 | Payer: BLUE CROSS/BLUE SHIELD | Primary: Family Medicine

## 2024-06-10 ENCOUNTER — Encounter: Admit: 2024-06-10 | Payer: BLUE CROSS/BLUE SHIELD | Primary: Family Medicine

## 2024-06-10 ENCOUNTER — Ambulatory Visit: Admit: 2024-06-10 | Payer: BLUE CROSS/BLUE SHIELD | Attending: Obstetrics & Gynecology | Primary: Family Medicine

## 2024-06-10 VITALS — BP 115/82 | HR 72 | Temp 97.90000°F | Resp 16 | Wt 188.2 lb

## 2024-06-10 DIAGNOSIS — N83201 Unspecified ovarian cyst, right side: Principal | ICD-10-CM

## 2024-06-10 DIAGNOSIS — Z975 Presence of (intrauterine) contraceptive device: Principal | ICD-10-CM

## 2024-06-10 NOTE — Progress Notes (Signed)
"  Janet Alexander is a 33 y.o. female  presents for a problem visit.    Chief Complaint   Patient presents with    Ultrasound         OB/GYN History  [id number] - LTCS x 1 and VBAC x 1  Hx of STI - No  Desire STD testing: No  SA - Yes, Female      No LMP recorded. (Menstrual status: IUD).  Menses: Non-Existent due to IUD  Contraception: IUD - Mirena         She is here for an ultrasound follow-up for ovarian cysts.      1. Have you been to the ER, urgent care clinic, or hospitalized since your last visit? No  2. Have you seen or consulted any other health care providers outside of the [facility] since your last visit? No      She declines  a chaperone during the gynecologic exam today.  "

## 2024-06-10 NOTE — Progress Notes (Signed)
 "Problem Visit    Chief Complaint   Patient presents with    Ultrasound     Janet Alexander is a 33 y.o.  presenting for problem visit. Her main concern today is follow up pelvic ultrasound.    Ultrasound 06/10/2024  Uterus is anteverted, normal in size and echogenicity. The IUD appears to be correctly positioned. Right adnexa appears within normal limits. The right ovary contains an anechoic structure measuring 42 x 32 x 14mm. Left adnexa appears within normal limits. The left ovary appears to contain an anechoic structure 40 x 24 x 32mm. No free fluid in the cul de sac.     IMPRESSION: Normal gynecologic anatomy. Bilateral simple appearing ovarian cysts, right simple cyst appears to be similarly sized as previously. IUD correctly positioned in fundal portion of endometrium    Janet Alexander is a former patient of Dr. Evelina. She was last seen in March for a problem visit. She has a known right ovarian cyst that has been followed on ultrasounds. She has previously had two episodes of pain in the right pelvis/RLQ since her last visit. She has a Mirena  IUD in place. She is very satisfied with the IUD and reports menses are lighter    Ultrasound 11/29/2023: Uterus is anteverted, normal in size and echogenicity. An IUD is seen in the proper fundal position of the endometrial cavity. Right adnexa appears within normal limits. A simple cyst is present arising from the right ovary vs adjacent to the right ovary measuring 42 x 28 x 35 mm. Left adnexa appears within normal limits. Left ovary appears normal. No free fluid in the cul de sac.     She was previously offered options for management of her cysts, including expectant management with continued surveillance versus surgical management with laparoscopy if she becomes symptomatic.     Today, she reports that she is doing well. She reports no ongoing pain. She reports that she has minimal bleeding with the IUD in place. She is currently working on getting her CPA (completing  in April - May 2026). She reports that she is considering another child once she has completed her CPA. She is comfortable with plan for yearly follow up of cyst if she does not desire pregnancy. She does have a surgical history significant for gastric bypass surgery. She has also had cervical cerclages placed in 2 prior pregnancies.     OB History   Gravida Para Term Preterm AB Living   5 2 2  0 3 2   SAB IAB Ectopic Molar Multiple Live Births   3 0 0 0  2      # Outcome Date GA Lbr Len/2nd Weight Sex Type Anes PTL Lv   5 Term 01/11/15 [redacted]w[redacted]d 05:45 / 02:04 3.955 kg (8 lb 11.5 oz) F VBAC EPI  LIV      Birth Comments: Hgb, Normal, Gaylon Screen Barcode: [id number] Date collected 01/12/2015   4 Term 03/15/11     CS-LTranv   LIV   3 SAB            2 SAB            1 SAB                Past Medical History:   Diagnosis Date    ADHD (attention deficit hyperactivity disorder) 09/2023    Anxiety     Asthma     Hypothyroidism     Iron deficiency anemia     Obesity  Postpartum depression 2016    VBAC (vaginal birth after Cesarean) 01/11/15       Past Surgical History:   Procedure Laterality Date    BARIATRIC SURGERY      CERVICAL CERCLAGE  2012    another in 2015    CESAREAN SECTION  2012    COLONOSCOPY  03/2023    GASTRIC BYPASS SURGERY  02/2021       Family History   Problem Relation Age of Onset    Diabetes Mother     Hypertension Mother     Scoliosis Mother     Obesity Mother     COPD Father     Colon Cancer Father     Alcohol Abuse Father     Hypertension Maternal Grandmother     Heart Disease Maternal Grandmother     High Blood Pressure Maternal Grandmother     Asthma Sister     Obesity Sister        Social History     Socioeconomic History    Marital status: Married     Spouse name: Not on file    Number of children: Not on file    Years of education: Not on file    Highest education level: Not on file   Occupational History    Not on file   Tobacco Use    Smoking status: Never     Passive exposure: Never     Smokeless tobacco: Never   Vaping Use    Vaping status: Never Used   Substance and Sexual Activity    Alcohol use: Yes     Comment: I dont drink weekly, maybe 1-2 times a month    Drug use: Yes     Frequency: 5.0 times per week     Types: Marijuana Oda)    Sexual activity: Yes     Partners: Male   Other Topics Concern    Not on file   Social History Narrative    ** Merged History Encounter **          Social Drivers of Health     Financial Resource Strain: Low Risk  (06/19/2023)    Overall Financial Resource Strain (CARDIA)     Difficulty of Paying Living Expenses: Not hard at all   Food Insecurity: Patient Declined (03/13/2024)    Received from [facility]    Hunger Vital Sign     Within the past 12 months, you worried that your food would run out before you got the money to buy more.: Patient declined     Within the past 12 months, the food you bought just didn't last and you didn't have money to get more.: Patient declined   Transportation Needs: No Transportation Needs (09/30/2023)    PRAPARE - Therapist, Art (Medical): No     Lack of Transportation (Non-Medical): No   Physical Activity: Not on file   Stress: Not on file   Social Connections: Unknown (01/15/2022)    Received from [facility]    Social Network     Social Network: Not on file   Intimate Partner Violence: Unknown (12/07/2021)    Received from [facility]    HITS     Physically Hurt: Not on file     Insult or Talk Down To: Not on file     Threaten Physical Harm: Not on file     Scream or Curse: Not on file   Housing Stability:  Low Risk  (09/30/2023)    Housing Stability Vital Sign     Unable to Pay for Housing in the Last Year: No     Number of Times Moved in the Last Year: 0     Homeless in the Last Year: No       Current Outpatient Medications   Medication Sig Dispense Refill    levothyroxine  (SYNTHROID ) 150 MCG tablet Take 1 tablet by mouth daily 90 tablet 0    buPROPion (WELLBUTRIN XL) 150 MG extended release tablet  Take 1 tablet by mouth every morning (Patient taking differently: Take 1 tablet by mouth in the morning and 1 tablet in the evening.)      levonorgestrel  (MIRENA ) IUD 52 mg       levocetirizine (XYZAL) 5 MG tablet TAKE 1 TABLET BY MOUTH EVERY MORNING AND TAKE ONE TABLET BY MOUTH AT BEDTIME      AIRSUPRA 90-80 MCG/ACT AERO Inhale 2 puffs into the lungs every 4 hours as needed      levothyroxine  (SYNTHROID ) 125 MCG tablet TAKE 1 TABLET BY MOUTH EVERY DAY 90 tablet 1     No current facility-administered medications for this visit.       Allergies   Allergen Reactions    Cat Hair Extract Cough, Itching and Shortness Of Breath    Molds & Smuts Shortness Of Breath    Cat Dander Other (See Comments)    Dust Mite Extract Rash       Review of Systems - History obtained from the patient  Constitutional: negative for weight loss, fever, night sweats  HEENT: negative for hearing loss, earache, congestion, snoring, sorethroat  CV: negative for chest pain, palpitations, edema  Resp: negative for cough, shortness of breath, wheezing  GI: negative for change in bowel habits, abdominal pain, black or bloody stools  GU: negative for frequency, dysuria, hematuria, vaginal discharge  MSK: negative for back pain, joint pain, muscle pain  Breast: negative for breast lumps, nipple discharge, galactorrhea  Skin :negative for itching, rash, hives  Neuro: negative for dizziness, headache, confusion, weakness  Psych: negative for anxiety, depression, change in mood  Heme/lymph: negative for bleeding, bruising, pallor    Physical Exam    BP 115/82 (BP Site: Right Upper Arm, Patient Position: Sitting, BP Cuff Size: Medium Adult)   Pulse 72   Temp 97.9 F (36.6 C) (Oral)   Resp 16   Wt 85.4 kg (188 lb 3.2 oz)   SpO2 97%   BMI 28.20 kg/m       OBGyn Exam      Constitutional  Appearance: well-nourished, well developed, alert, in no acute distress    HENT  Head and Face: appears normal    Neck  Inspection/Palpation: normal appearance, no  masses or tenderness  Thyroid: gland size normal, nontender    Chest  Respiratory Effort: non-labored breathing    Cardiovascular  Extremities: no peripheral edema    Gastrointestinal  Abdominal Examination: abdomen non-distended, non-tender to palpation, no masses present  Liver and spleen: no hepatomegaly present, spleen not palpable  Hernias: no hernias identified    Genitourinary deferred    Skin  General Inspection: no rash, no lesions identified    Neurologic/Psychiatric  Mental Status:  Orientation: grossly oriented to person, place and time  Mood and Affect: mood normal, affect appropriate      Assessment/Plan:  Janet Alexander is a 33 y.o. who presents for the following problems.    1. IUD (intrauterine device)  in place  - IUD identified on ultrasound, correctly placed in fundal portion of endometrium    2. Bilateral ovarian cysts  - Reviewed ultrasound images in PACS and with patient. She has a normal appearing uterus with IUD in place. She has a known right ovarian cyst- this appears simple and appears to be measuring similar size as last seen on ultrasound in March 2025  - She has a new left ovarian simple cyst seen on ultrasound today (also measuring approximately 4 cm)  - Discussed recommendation to repeat ultrasounds annually  - Reviewed ER precautions including ovarian torsion, ovarian cyst rupture.     3. Fertility planning  - She is possibly interested in having another child- she is not yet at the point where she would like IUD removal  - She has previously had gastric bypass surgery and has lost a significant amount of weight  - She does report a history of 2 previous cerclages  - We briefly discussed recommendation to start folic acid. She will follow up once she is ready to proceed with IUD removal      Comer Cea, MD  "

## 2024-07-20 MED ORDER — LEVOTHYROXINE SODIUM 150 MCG PO TABS
150 | ORAL_TABLET | Freq: Every day | ORAL | 1 refills | 90.00000 days | Status: AC
Start: 2024-07-20 — End: ?
